# Patient Record
Sex: Male | Born: 1970 | Race: White | Hispanic: No | Marital: Married | Smoking: Former smoker
Health system: Southern US, Community
[De-identification: ages and names within clinical notes are randomized; demographics above are authoritative.]

## PROBLEM LIST (undated history)

## (undated) DIAGNOSIS — R5383 Other fatigue: Secondary | ICD-10-CM

---

## 2006-10-17 ENCOUNTER — Ambulatory Visit: Payer: Self-pay | Admitting: Family Medicine

## 2006-10-20 DIAGNOSIS — F988 Other specified behavioral and emotional disorders with onset usually occurring in childhood and adolescence: Secondary | ICD-10-CM

## 2006-11-17 ENCOUNTER — Ambulatory Visit: Payer: Self-pay | Admitting: Family Medicine

## 2006-12-12 ENCOUNTER — Telehealth: Payer: Self-pay | Admitting: Family Medicine

## 2007-01-21 ENCOUNTER — Telehealth: Payer: Self-pay | Admitting: Family Medicine

## 2007-05-19 ENCOUNTER — Telehealth: Payer: Self-pay | Admitting: Family Medicine

## 2007-06-12 ENCOUNTER — Telehealth: Payer: Self-pay | Admitting: Family Medicine

## 2007-07-09 ENCOUNTER — Ambulatory Visit: Payer: Self-pay | Admitting: Family Medicine

## 2007-07-09 DIAGNOSIS — L259 Unspecified contact dermatitis, unspecified cause: Secondary | ICD-10-CM

## 2007-07-17 ENCOUNTER — Telehealth: Payer: Self-pay | Admitting: Family Medicine

## 2007-08-13 ENCOUNTER — Telehealth: Payer: Self-pay | Admitting: Family Medicine

## 2007-09-14 ENCOUNTER — Telehealth: Payer: Self-pay | Admitting: Family Medicine

## 2007-10-16 ENCOUNTER — Telehealth: Payer: Self-pay | Admitting: Family Medicine

## 2007-11-04 ENCOUNTER — Ambulatory Visit: Payer: Self-pay | Admitting: Family Medicine

## 2007-11-04 DIAGNOSIS — K5289 Other specified noninfective gastroenteritis and colitis: Secondary | ICD-10-CM | POA: Insufficient documentation

## 2007-11-17 ENCOUNTER — Telehealth: Payer: Self-pay | Admitting: Family Medicine

## 2008-01-05 ENCOUNTER — Telehealth: Payer: Self-pay | Admitting: Family Medicine

## 2008-01-28 ENCOUNTER — Ambulatory Visit: Payer: Self-pay | Admitting: Family Medicine

## 2008-01-28 DIAGNOSIS — J019 Acute sinusitis, unspecified: Secondary | ICD-10-CM

## 2008-02-05 ENCOUNTER — Telehealth: Payer: Self-pay | Admitting: Family Medicine

## 2008-03-04 ENCOUNTER — Telehealth: Payer: Self-pay | Admitting: Family Medicine

## 2008-04-04 ENCOUNTER — Telehealth: Payer: Self-pay | Admitting: Family Medicine

## 2008-05-04 ENCOUNTER — Telehealth: Payer: Self-pay | Admitting: Family Medicine

## 2008-06-06 ENCOUNTER — Telehealth: Payer: Self-pay | Admitting: Family Medicine

## 2008-06-28 ENCOUNTER — Ambulatory Visit: Payer: Self-pay | Admitting: Family Medicine

## 2008-07-07 ENCOUNTER — Telehealth: Payer: Self-pay | Admitting: Family Medicine

## 2008-07-25 ENCOUNTER — Telehealth: Payer: Self-pay | Admitting: Family Medicine

## 2008-07-28 ENCOUNTER — Ambulatory Visit: Payer: Self-pay | Admitting: Family Medicine

## 2008-07-28 ENCOUNTER — Encounter: Payer: Self-pay | Admitting: Family Medicine

## 2008-08-02 ENCOUNTER — Telehealth: Payer: Self-pay | Admitting: Family Medicine

## 2008-08-10 ENCOUNTER — Telehealth: Payer: Self-pay | Admitting: Family Medicine

## 2008-08-11 ENCOUNTER — Telehealth: Payer: Self-pay | Admitting: Family Medicine

## 2008-09-19 ENCOUNTER — Telehealth: Payer: Self-pay | Admitting: Family Medicine

## 2008-10-24 ENCOUNTER — Telehealth: Payer: Self-pay | Admitting: Family Medicine

## 2008-12-02 ENCOUNTER — Telehealth: Payer: Self-pay | Admitting: Family Medicine

## 2009-01-19 ENCOUNTER — Telehealth: Payer: Self-pay | Admitting: Family Medicine

## 2009-03-02 ENCOUNTER — Telehealth: Payer: Self-pay | Admitting: Family Medicine

## 2009-04-07 ENCOUNTER — Telehealth: Payer: Self-pay | Admitting: Family Medicine

## 2009-05-12 ENCOUNTER — Telehealth: Payer: Self-pay | Admitting: Family Medicine

## 2009-06-14 ENCOUNTER — Telehealth: Payer: Self-pay | Admitting: Family Medicine

## 2009-07-12 ENCOUNTER — Telehealth: Payer: Self-pay | Admitting: Family Medicine

## 2009-08-14 ENCOUNTER — Telehealth: Payer: Self-pay | Admitting: Family Medicine

## 2009-11-27 ENCOUNTER — Ambulatory Visit: Payer: Self-pay | Admitting: Family Medicine

## 2010-01-05 ENCOUNTER — Telehealth (INDEPENDENT_AMBULATORY_CARE_PROVIDER_SITE_OTHER): Payer: Self-pay | Admitting: *Deleted

## 2010-03-05 ENCOUNTER — Telehealth: Payer: Self-pay | Admitting: Family Medicine

## 2010-04-23 ENCOUNTER — Telehealth: Payer: Self-pay | Admitting: Family Medicine

## 2010-05-24 ENCOUNTER — Telehealth: Payer: Self-pay | Admitting: Family Medicine

## 2010-06-27 ENCOUNTER — Ambulatory Visit: Payer: Self-pay | Admitting: Family Medicine

## 2010-06-27 DIAGNOSIS — R109 Unspecified abdominal pain: Secondary | ICD-10-CM | POA: Insufficient documentation

## 2010-06-27 LAB — CONVERTED CEMR LAB
Ketones, urine, test strip: NEGATIVE
Potassium: 4.2 meq/L (ref 3.5–5.3)
Protein, U semiquant: NEGATIVE
Sodium: 137 meq/L (ref 135–145)
Urobilinogen, UA: 0.2
WBC Urine, dipstick: NEGATIVE

## 2010-06-28 ENCOUNTER — Encounter: Admission: RE | Admit: 2010-06-28 | Discharge: 2010-06-28 | Payer: Self-pay | Admitting: Emergency Medicine

## 2010-06-28 ENCOUNTER — Encounter: Payer: Self-pay | Admitting: Family Medicine

## 2010-07-06 ENCOUNTER — Telehealth: Payer: Self-pay | Admitting: Family Medicine

## 2010-08-08 ENCOUNTER — Telehealth: Payer: Self-pay | Admitting: Family Medicine

## 2011-01-22 NOTE — Letter (Signed)
Summary: Out of Work  [facility]  [address]   [place], [place] [address]   Phone: [phone]  Fax: [phone]    June 28, 2010   Employee:  Warren Rivas    To Whom It May Concern:   For Medical reasons, please excuse the above named employee from work today and tomorrow.   If you need additional information, please feel free to contact our office.         Sincerely,    Donna Christen MD

## 2011-01-22 NOTE — Progress Notes (Signed)
Summary: Adderall refill  Phone Note Refill Request Message from:  Patient on Apr 23, 2010 2:47 PM  Refills Requested: Medication #1:  ADDERALL XR 25 MG CP24 Take 1 tablet by mouth once a day AM. Initial call taken by: Payton Spark CMA,  Apr 23, 2010 2:47 PM    Prescriptions: ADDERALL XR 25 MG CP24 (AMPHETAMINE-DEXTROAMPHETAMINE) Take 1 tablet by mouth once a day AM  #30 x 0   Entered and Authorized by:   Seymour Bars DO   Signed by:   Seymour Bars DO on 04/23/2010   Method used:   Print then Give to Patient   RxID:   [id number]

## 2011-01-22 NOTE — Progress Notes (Signed)
Summary: Adderall refill  Phone Note Refill Request Message from:  Patient on August 08, 2010 12:18 PM  Refills Requested: Medication #1:  ADDERALL XR 25 MG CP24 Take 1 tablet by mouth once a day AM Initial call taken by: Payton Spark CMA,  August 08, 2010 12:18 PM  Follow-up for Phone Call        due for OV prior to RF Follow-up by: Seymour Bars DO,  August 08, 2010 1:01 PM     Appended Document: Adderall refill Pt aware of the above

## 2011-01-22 NOTE — Progress Notes (Signed)
Summary: Adderall refill  Phone Note Refill Request   Refills Requested: Medication #1:  ADDERALL XR 25 MG CP24 Take 1 tablet by mouth once a day AM Initial call taken by: Payton Spark CMA,  July 06, 2010 9:55 AM    Prescriptions: ADDERALL XR 25 MG CP24 (AMPHETAMINE-DEXTROAMPHETAMINE) Take 1 tablet by mouth once a day AM  #30 x 0   Entered and Authorized by:   Seymour Bars DO   Signed by:   Seymour Bars DO on 07/06/2010   Method used:   Print then Give to Patient   RxID:   [id number]

## 2011-01-22 NOTE — Progress Notes (Signed)
Summary: Adderall refill  Phone Note Refill Request   Refills Requested: Medication #1:  ADDERALL XR 25 MG CP24 Take 1 tablet by mouth once a day AM. Initial call taken by: Payton Spark CMA,  March 05, 2010 12:40 PM    Prescriptions: ADDERALL XR 25 MG CP24 (AMPHETAMINE-DEXTROAMPHETAMINE) Take 1 tablet by mouth once a day AM  #30 x 0   Entered and Authorized by:   Nani Gasser MD   Signed by:   Nani Gasser MD on 03/05/2010   Method used:   Print then Give to Patient   RxID:   [id number]

## 2011-01-22 NOTE — Progress Notes (Signed)
Summary: Adderall refill  Phone Note Refill Request   Refills Requested: Medication #1:  ADDERALL XR 25 MG CP24 Take 1 tablet by mouth once a day AM. Initial call taken by: Payton Spark CMA,  May 24, 2010 9:26 AM    Prescriptions: ADDERALL XR 25 MG CP24 (AMPHETAMINE-DEXTROAMPHETAMINE) Take 1 tablet by mouth once a day AM  #30 x 0   Entered and Authorized by:   Seymour Bars DO   Signed by:   Seymour Bars DO on 05/24/2010   Method used:   Print then Give to Patient   RxID:   [id number]

## 2011-01-22 NOTE — Progress Notes (Signed)
Summary: Adderall Rx  Phone Note Refill Request Message from:  Patient on January 05, 2010 9:01 AM  Refills Requested: Medication #1:  ADDERALL XR 25 MG CP24 Take 1 tablet by mouth once a day AM. Initial call taken by: Payton Spark CMA,  January 05, 2010 9:01 AM    Prescriptions: ADDERALL XR 25 MG CP24 (AMPHETAMINE-DEXTROAMPHETAMINE) Take 1 tablet by mouth once a day AM  #30 x 0   Entered by:   Payton Spark CMA   Authorized by:   Seymour Bars DO   Signed by:   Payton Spark CMA on 01/05/2010   Method used:   Print then Give to Patient   RxID:   [id number]

## 2011-01-22 NOTE — Assessment & Plan Note (Signed)
Summary: STOMACH/BACK PAIN   Vital Signs:  Patient Profile:   40 Years Old Male CC:      back and abdominal pain X 4 days Height:     69.75 inches Weight:      188 pounds O2 Sat:      100 % O2 treatment:    Room Air Temp:     97.0 degrees F oral Pulse rate:   55 / minute Resp:     14 per minute BP sitting:   132 / 93  (right arm) Cuff size:   regular  Pt. in pain?   yes    Location:   abdomen/lower back    Intensity:   4    Type:       dull  Vitals Entered By: Lajean Saver RN (June 27, 2010 10:22 AM)                   Updated Prior Medication List: ADDERALL XR 25 MG CP24 (AMPHETAMINE-DEXTROAMPHETAMINE) Take 1 tablet by mouth once a day AM  Current Allergies (reviewed today): No known allergies History of Present Illness Chief Complaint: back and abdominal pain X 4 days History of Present Illness:  Subjective:  Patient complains of onset of lower abdominal pain 5 days ago after eating a hamburger for supper.  Since then he has had constant hypogastric crampy pain, occasionally radiating to the right back, and over the past 24 hours radiating to the groin area.  He has had mild nausea (but no vomiting) and anorexia.  He has had continuous tenesmus, but minimal bowel movements.  He had chills and low grade fever yesterday, and felt dizzy today.  No urinary symptoms.  No respiratory symptoms.  No chest pain.  His lower abdominal pain is somewhat worse with movement.  No recent change in bowel movements prior to his present illness.  He takes Adderall for ADD. No past surgical history.  He has been healthy in the past.    REVIEW OF SYSTEMS Constitutional Symptoms      Denies fever, chills, night sweats, weight loss, weight gain, and fatigue.  Eyes       Denies change in vision, eye pain, eye discharge, glasses, contact lenses, and eye surgery. Ear/Nose/Throat/Mouth       Denies hearing loss/aids, change in hearing, ear pain, ear discharge, dizziness, frequent runny nose,  frequent nose bleeds, sinus problems, sore throat, hoarseness, and tooth pain or bleeding.  Respiratory       Denies dry cough, productive cough, wheezing, shortness of breath, asthma, bronchitis, and emphysema/COPD.  Cardiovascular       Denies murmurs, chest pain, and tires easily with exhertion.    Gastrointestinal       Complains of stomach pain, nausea/vomiting, and diarrhea.      Denies constipation, blood in bowel movements, and indigestion. Genitourniary       Denies painful urination, kidney stones, and loss of urinary control.      Comments: urinary frequency/urgency Neurological       Denies paralysis, seizures, and fainting/blackouts. Musculoskeletal       Denies muscle pain, joint pain, joint stiffness, decreased range of motion, redness, swelling, muscle weakness, and gout.  Skin       Denies bruising, unusual mles/lumps or sores, and hair/skin or nail changes.  Psych       Denies mood changes, temper/anger issues, anxiety/stress, speech problems, depression, and sleep problems. Other Comments: symptoms started late friday night sunnenly. Intermittent thorughout the  weekend, and resumed suddenly today.   Past History:  Past Medical History: Reviewed history from 07/09/2007 and no changes required. ADD  Past Surgical History: Denies surgical history  Family History: Reviewed history from 10/20/2006 and no changes required. father died of lung cancer mother healthy brother and sister healthy  Social History: Reviewed history from 10/20/2006 and no changes required. Patient is a former smoker.  Route driver for [employer] Married to Amy.  Has 3 kids. Exercises regularly   Objective:  Appearance:  Patient appears healthy, stated age, and in no acute distress  Eyes:  Pupils are equal, round, and reactive to light and accomdation.  Extraocular movement is intact.  Conjunctivae are not inflamed.  Mouth:  moist mucous membranes  Neck:  Supple.  No adenopathy is  present.  No thyromegaly is present  Lungs:  Clear to auscultation.  Breath sounds are equal.  Heart:  Regular rate and rhythm without murmurs, rubs, or gallops.  Abdomen:   There is tenderness in the peri-umbilical and left lower abdomen over course of descending colon.  No masses or hepatosplenomegaly.  Bowel sounds are present.  No CVA or flank tenderness.  No distinct tenderness over McBurney's point.  No rebound tenderness. Genitourinary:  Penis normal without lesions or urethral discharge.  Scrotum is normal.  Testes are descended bilaterally without nodules or tenderness.  No hernias are palpated.  No regional lymphadenopathy palpated.   Rectal Exam:  Anus is normal without surrounding erythema.   No external hemorrhoids are present.  No lesions are palpated in the rectal vault.  Stool is heme negative.  Prostate is slightly enlarged but symmetric without tenderness or nodules.  urinalysis (dipstick):  negative CBC:  WBC 5.9, Hgb 14.6 Assessment New Problems: ABDOMINAL PAIN (ICD-789.00)  SUSPECT DIVERTICULITIS  Plan New Medications/Changes: LORTAB 5 5-500 MG TABS (HYDROCODONE-ACETAMINOPHEN) One by mouth q4 to 6hr as needed pain.  #10 (ten) x 0, 06/27/2010, Donna Christen MD ZOFRAN 4 MG TABS (ONDANSETRON HCL) One or two tabs by mouth two times a day as needed for nausea  #Ten (10) x 0, 06/27/2010, Donna Christen MD METRONIDAZOLE 500 MG TABS (METRONIDAZOLE) One by mouth q6hr  #28 x 0, 06/27/2010, Donna Christen MD CIPROFLOXACIN HCL 750 MG TABS (CIPROFLOXACIN HCL) One by mouth two times a day  #14 x 0, 06/27/2010, Donna Christen MD  New Orders: Urinalysis [81003-65000] CBC w/Diff [09811-91478] T-Basic Metabolic Panel [[id number]] T-Comprehensive Metabolic Panel [80053-22900] T-DG ABD 2 Views [74020] CT Abdomen/Pelvis w/ Contrast [CT a/p w/cont] Planning Comments:   Clear liquids through tomorrow then advance.  Begin Cipro and Flagyl.  Analgesic. Zofran for nausea. Return for worsening  symptoms.   The patient and/or caregiver has been counseled thoroughly with regard to medications prescribed including dosage, schedule, interactions, rationale for use, and possible side effects and they verbalize understanding.  Diagnoses and expected course of recovery discussed and will return if not improved as expected or if the condition worsens. Patient and/or caregiver verbalized understanding.  Prescriptions: LORTAB 5 5-500 MG TABS (HYDROCODONE-ACETAMINOPHEN) One by mouth q4 to 6hr as needed pain.  #10 (ten) x 0   Entered and Authorized by:   Donna Christen MD   Signed by:   Donna Christen MD on 06/27/2010   Method used:   Print then Give to Patient   RxID:   [id number] ZOFRAN 4 MG TABS (ONDANSETRON HCL) One or two tabs by mouth two times a day as needed for nausea  #Ten (10) x 0   Entered and  Authorized by:   Donna Christen MD   Signed by:   Donna Christen MD on 06/27/2010   Method used:   Print then Give to Patient   RxID:   [id number] METRONIDAZOLE 500 MG TABS (METRONIDAZOLE) One by mouth q6hr  #28 x 0   Entered and Authorized by:   Donna Christen MD   Signed by:   Donna Christen MD on 06/27/2010   Method used:   Print then Give to Patient   RxID:   [id number] CIPROFLOXACIN HCL 750 MG TABS (CIPROFLOXACIN HCL) One by mouth two times a day  #14 x 0   Entered and Authorized by:   Donna Christen MD   Signed by:   Donna Christen MD on 06/27/2010   Method used:   Print then Give to Patient   RxID:   [id number]   Patient Instructions: 1)  Will obtain CT w/ contrast either today or tomorrow.  If pain increases or worsening fever or new symptoms, go to the ER.  Will call patient with results of CT and with lab results.  Patient understands and agrees to this.  May need to schedule a follow up with your PCP or gastroenterologist depending on the test results and on how your treatment goes.  Orders Added: 1)  Urinalysis [81003-65000] 2)  CBC w/Diff [[id number]] 3)   T-Basic Metabolic Panel [[id number]] 4)  T-Comprehensive Metabolic Panel [80053-22900] 5)  T-DG ABD 2 Views [74020] 6)  CT Abdomen/Pelvis w/ Contrast [CT a/p w/cont]  Laboratory Results   Urine Tests  Date/Time Received: June 27, 2010 11:24 AM  Date/Time Reported: June 27, 2010 11:24 AM   Routine Urinalysis   Color: yellow Appearance: Clear Glucose: negative   (Normal Range: Negative) Bilirubin: negative   (Normal Range: Negative) Ketone: negative   (Normal Range: Negative) Spec. Gravity: 1.020   (Normal Range: 1.003-1.035) Blood: moderate   (Normal Range: Negative) pH: 5.5   (Normal Range: 5.0-8.0) Protein: negative   (Normal Range: Negative) Urobilinogen: 0.2   (Normal Range: 0-1) Nitrite: negative   (Normal Range: Negative) Leukocyte Esterace: negative   (Normal Range: Negative)

## 2012-10-06 ENCOUNTER — Emergency Department
Admission: EM | Admit: 2012-10-06 | Discharge: 2012-10-06 | Disposition: A | Discharge status: Home / Self Care | Payer: BC Managed Care – PPO | Source: Home / Self Care

## 2012-10-06 ENCOUNTER — Ambulatory Visit (HOSPITAL_BASED_OUTPATIENT_CLINIC_OR_DEPARTMENT_OTHER)
Admission: RE | Admit: 2012-10-06 | Discharge: 2012-10-06 | Disposition: A | Discharge status: Home / Self Care | Payer: BC Managed Care – PPO | Source: Ambulatory Visit | Attending: Family Medicine | Admitting: Family Medicine

## 2012-10-06 ENCOUNTER — Other Ambulatory Visit: Payer: Self-pay | Admitting: Family Medicine

## 2012-10-06 ENCOUNTER — Encounter: Payer: Self-pay | Admitting: *Deleted

## 2012-10-06 DIAGNOSIS — R109 Unspecified abdominal pain: Secondary | ICD-10-CM

## 2012-10-06 DIAGNOSIS — K859 Acute pancreatitis without necrosis or infection, unspecified: Secondary | ICD-10-CM

## 2012-10-06 LAB — POCT CBC W AUTO DIFF (K'VILLE URGENT CARE)

## 2012-10-06 MED ORDER — IOHEXOL 300 MG/ML  SOLN
100.0000 mL | Freq: Once | INTRAMUSCULAR | Status: AC | PRN
  Administered 2012-10-06: 100 mL via INTRAVENOUS

## 2012-10-06 MED ORDER — HYDROCODONE-ACETAMINOPHEN 5-500 MG PO TABS: 1.0000 | ORAL_TABLET | Freq: Four times a day (QID) | ORAL | Status: DC | PRN

## 2012-10-06 MED ORDER — ONDANSETRON HCL 4 MG PO TABS: 4.0000 mg | ORAL_TABLET | Freq: Three times a day (TID) | ORAL | Status: DC | PRN

## 2012-10-06 NOTE — ED Notes (Signed)
Pt c/o increased bowel sounds x 2 wks. He also c/o abdominal cramping and a little nausea x today. Denies fever.

## 2012-10-06 NOTE — ED Provider Notes (Signed)
History     CSN: [id number]  Arrival date & time 10/06/12  1141   First MD Initiated Contact with Patient 10/06/12 1151      Chief Complaint  Patient presents with  . Abdominal Pain   HPI Pt presents today with abdominal pain x 1 day.  Pt reports a baseline hx/o pancreatitis flare back in 2011.  Pt was seen by GI at follow up and that was the working diagnosis.  Pt states that he has been relatively asymptomatic since this point. However, he has noticed worsening stomach "gurgling" and discomfort.  Pt states that he woke up this am with central abdominal pain that radiated to his back.  Pt states that pain seems to intermittent with around 1 min episodes of periumbilical/central pain.  Pain radiates to back. Flares have been around 8/10. Last flare was 2 hours ago.  Pt denies any trauma.  No ETOH, smoking. No known hx/o gallstones per pt.  Currently not on medications.  Has been able to tolerate po liquids well.  No diarrhea, vomiting, dysuria, or increased urinary frequency.   History reviewed. No pertinent past medical history.  History reviewed. No pertinent past surgical history.  Family History  Problem Relation Age of Onset  . Cancer Father     lung    History  Substance Use Topics  . Smoking status: Former Games developer  . Smokeless tobacco: Not on file  . Alcohol Use: No      Review of Systems  All other systems reviewed and are negative.    Allergies  Review of patient's allergies indicates no known allergies.  Home Medications  No current outpatient prescriptions on file.  BP 127/85  Pulse 63  Temp 98 F (36.7 C) (Oral)  Resp 18  Ht 5\' 10"  (1.778 m)  Wt 200 lb (90.719 kg)  BMI 28.70 kg/m2  SpO2 99%  Physical Exam  Constitutional: He appears well-developed and well-nourished.  HENT:  Head: Normocephalic and atraumatic.  Eyes: Conjunctivae normal are normal. Pupils are equal, round, and reactive to light.  Neck: Normal range of motion. Neck  supple.  Cardiovascular: Normal rate and regular rhythm.   Pulmonary/Chest: Effort normal and breath sounds normal.  Abdominal: Soft. Bowel sounds are normal.       No flank pain/CVA tenderness.  + mild periumbilical pain.  No RLQ pain/guarding.  No peritoneal signs.   Musculoskeletal: Normal range of motion.  Neurological: He is alert.  Skin: Skin is warm.    ED Course  Procedures (including critical care time)   Labs Reviewed  POCT CBC W AUTO DIFF ([facility])  COMPREHENSIVE METABOLIC PANEL  LACTATE DEHYDROGENASE  LIPASE, BLOOD   No results found.   1. Abdominal  pain, other specified site   2. Pancreatitis       MDM  Suspect that this may be a recurrence of pancreatitis given history.  WIll check baseline labs including CBC, CMET, LDH, and lipase.  Will also check CT Abd and Pelvis with contrast to eval pancreas.  Will place on bowel rest with prn vicodin and zofran for symptomatic improvement.  Follow up with gastroenterology in 24-48 hours.  Discussed systemic and GI red flags at length including fever, worsening abd pain, and uncontrollable nausea for reevaluation.  Pt feels comfortable with outpatient management of sxs currently as this has worked well in the past. Broached issue of inpt management if sxs worsen.      The patient and/or caregiver has been counseled thoroughly  with regard to treatment plan and/or medications prescribed including dosage, schedule, interactions, rationale for use, and possible side effects and they verbalize understanding. Diagnoses and expected course of recovery discussed and will return if not improved as expected or if the condition worsens. Patient and/or caregiver verbalized understanding.            Doree Albee, MD 10/06/12 1257

## 2012-10-06 NOTE — ED Notes (Signed)
Patient schedule to have CT with contrast @ [facility]. Oral contrast given and explained. Verbalized understanding.

## 2012-10-07 LAB — COMPREHENSIVE METABOLIC PANEL
ALT: 14 U/L (ref 0–53)
AST: 19 U/L (ref 0–37)
Albumin: 4.6 g/dL (ref 3.5–5.2)
BUN: 12 mg/dL (ref 6–23)
Chloride: 104 mEq/L (ref 96–112)
Creat: 0.98 mg/dL (ref 0.50–1.35)
Glucose, Bld: 98 mg/dL (ref 70–99)
Total Bilirubin: 0.4 mg/dL (ref 0.3–1.2)

## 2012-10-07 LAB — LACTATE DEHYDROGENASE: LDH: 136 U/L (ref 94–250)

## 2012-10-08 ENCOUNTER — Telehealth: Payer: Self-pay | Admitting: Emergency Medicine

## 2013-04-17 IMAGING — CT CT ABD-PELV W/ CM
2 of 5 series · 17 of 46 positions shown, 19 images · IV contrast (omnipaque)
Comparison: 06/28/2010 CT

CLINICAL DATA: 40-year-old male with abdominal and pelvic pain.

CT ABDOMEN AND PELVIS WITH CONTRAST
TECHNIQUE: Multidetector CT imaging of the abdomen and pelvis was
performed following the standard protocol during bolus
administration of intravenous contrast.
Contrast: 100mL OMNIPAQUE IOHEXOL 300 MG/ML  SOLN

[Series 2: abd/pelvis 5.0 b31f · axial · 0.75mm/px · z∈[-506,-71]mm · 14 of 99 slices shown, 16 images]
[im 6/99  soft-tissue]
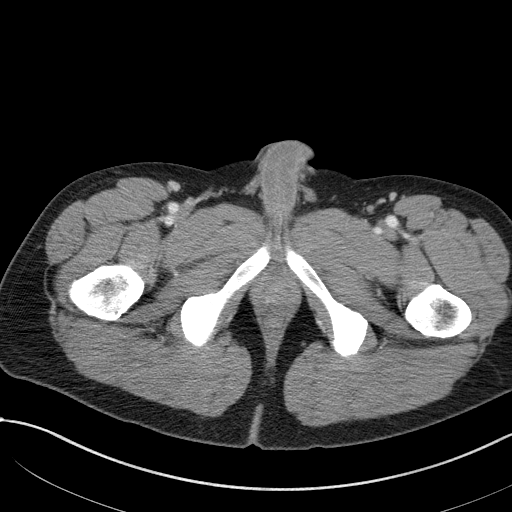
[im 6/99  bone]
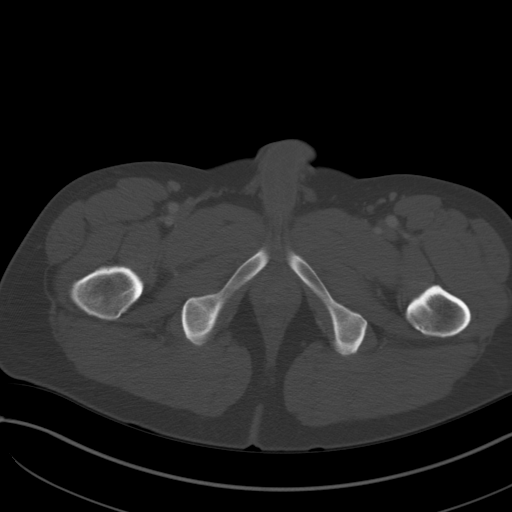
[im 11/99  soft-tissue]
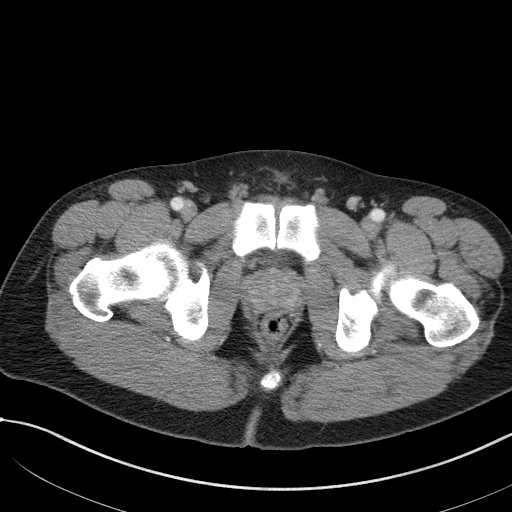
[im 22/99  soft-tissue]
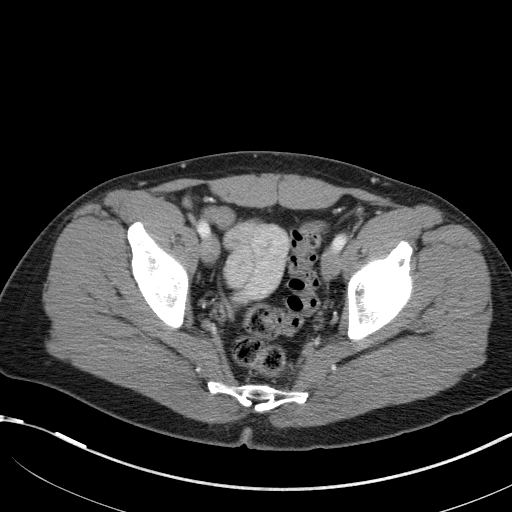
[im 28/99  soft-tissue]
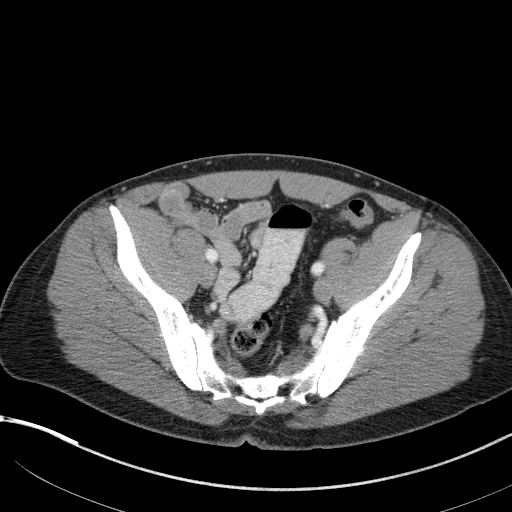
[im 33/99  soft-tissue]
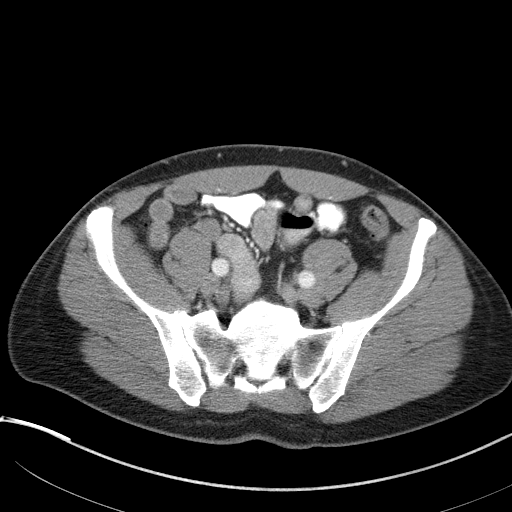
[im 39/99  soft-tissue]
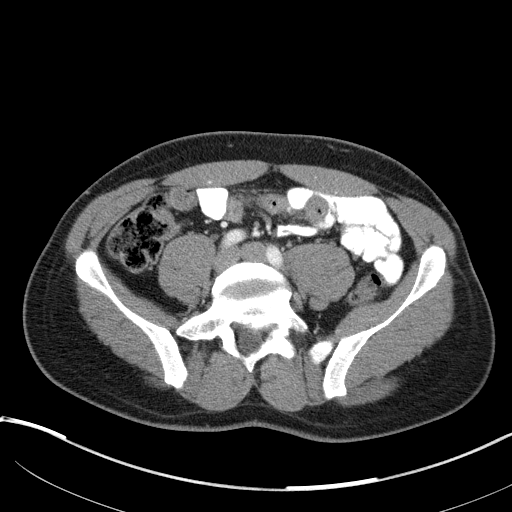
[im 44/99  soft-tissue]
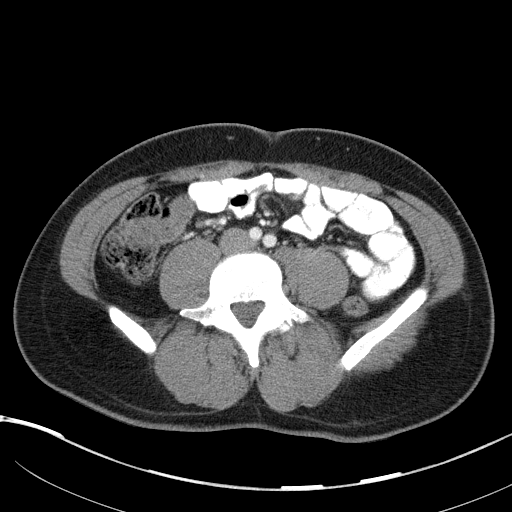
[im 55/99  soft-tissue]
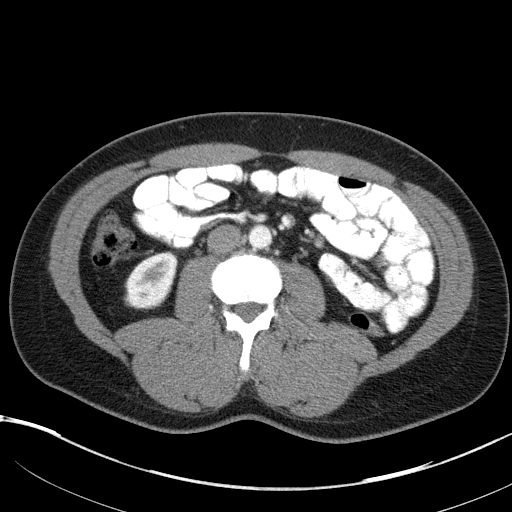
[im 60/99  soft-tissue]
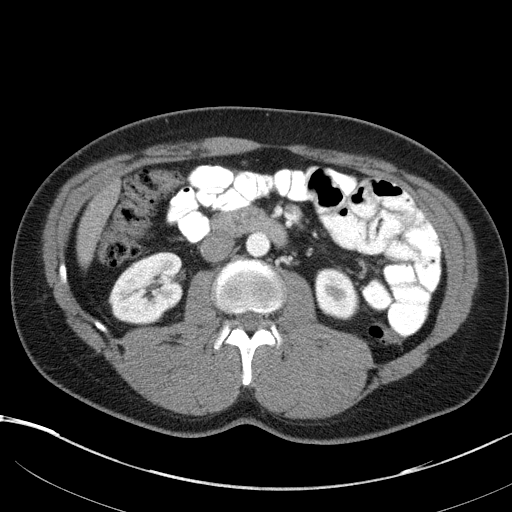
[im 60/99  bone]
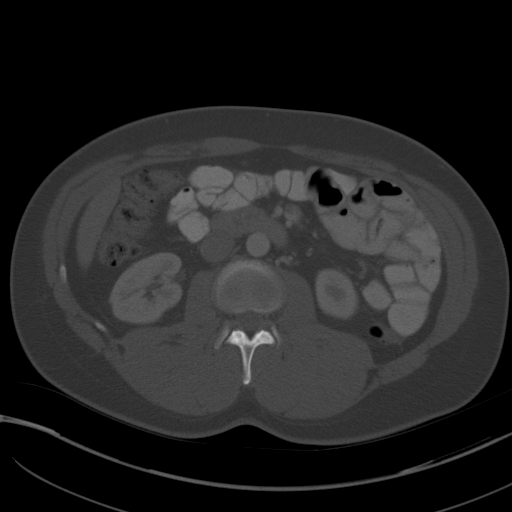
[im 66/99  soft-tissue]
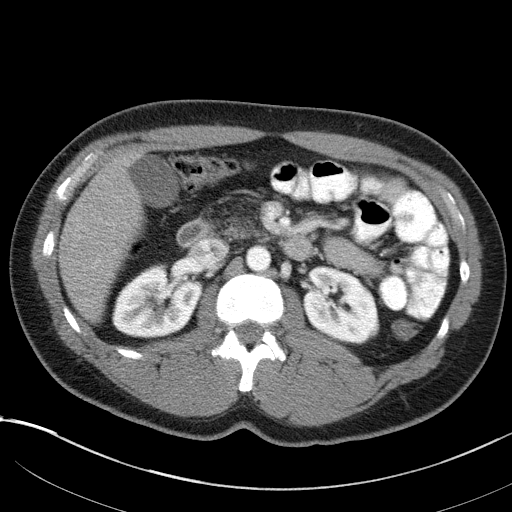
[im 71/99  soft-tissue]
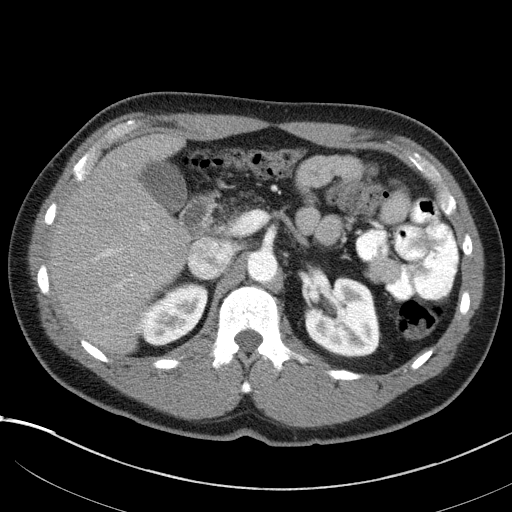
[im 77/99  soft-tissue]
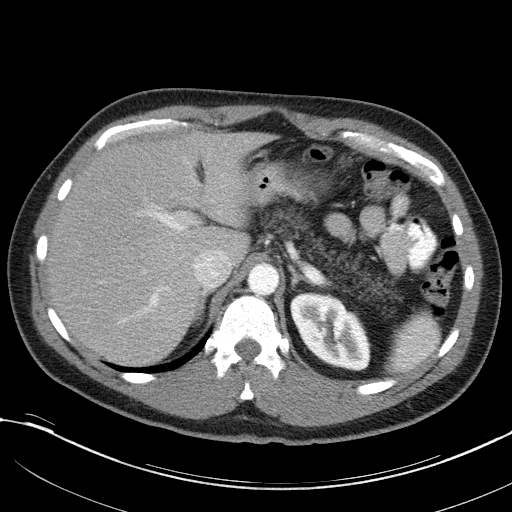
[im 88/99  soft-tissue]
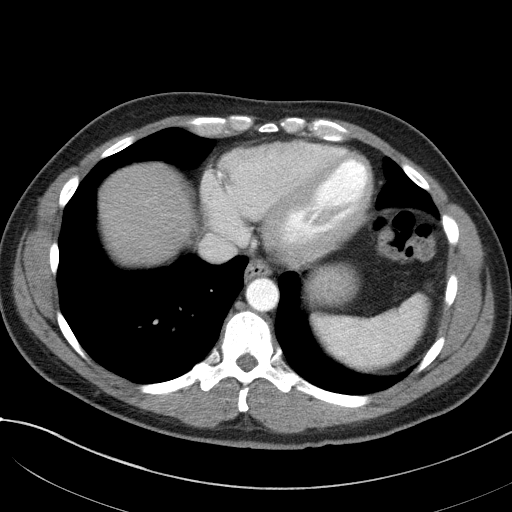
[im 93/99  soft-tissue]
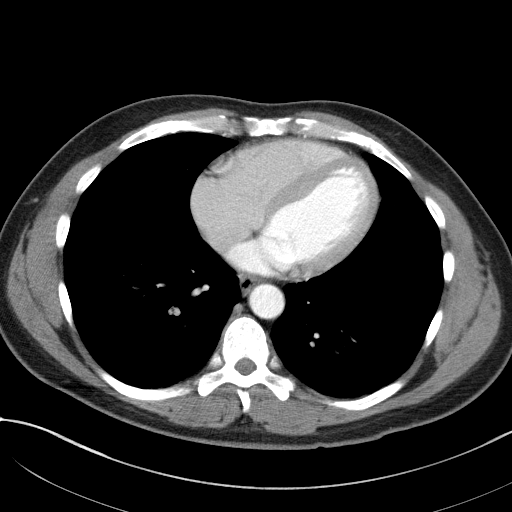

[Series 5: abd/pelvis 3.0 coronal · coronal · 0.79mm/px · 3 of 87 slices shown]
[im 29/87  soft-tissue]
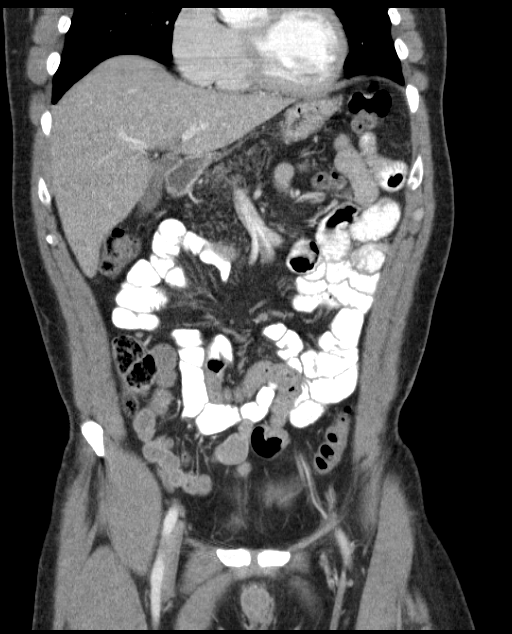
[im 39/87  soft-tissue]
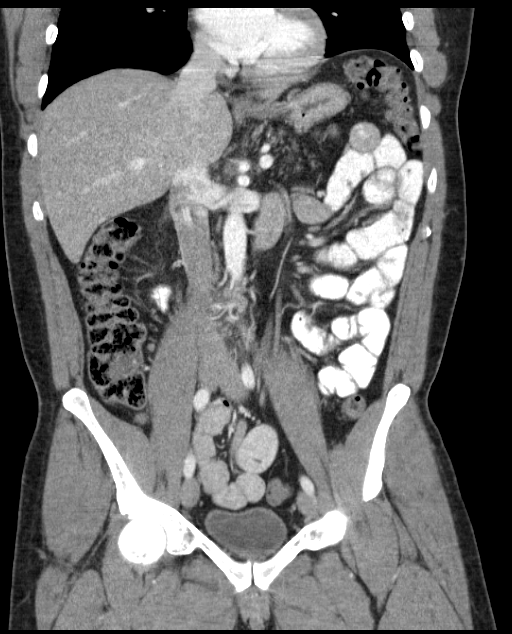
[im 48/87  soft-tissue]
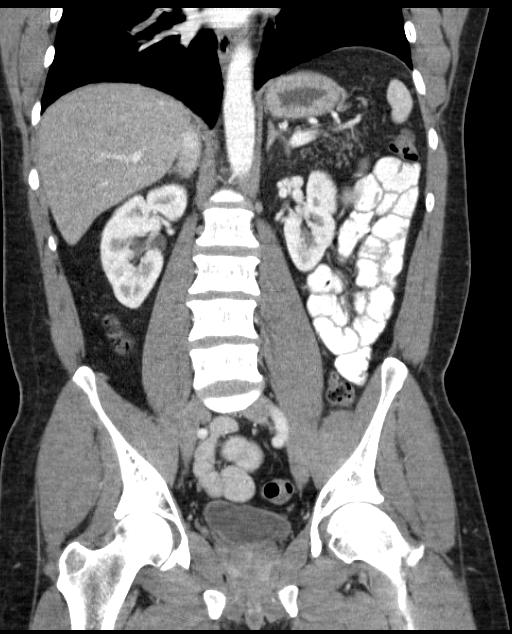

[17 of 46 positions shown; findings below may reference images not displayed]

FINDINGS: Probable mild fatty infiltration of the liver is
identified.
The spleen, gallbladder, adrenal glands and kidneys are
unremarkable.
Fatty replacement of the pancreas is again identified.  There is no
evidence of peripancreatic inflammation or fluid.

No free fluid, enlarged lymph nodes, biliary dilation or abdominal
aortic aneurysm identified.

The bowel and bladder are unremarkable.
The appendix is normal.
No acute or suspicious bony abnormalities identified.
IMPRESSION: No evidence of acute abnormality.

Question mild fatty infiltration of the liver.

## 2013-10-19 ENCOUNTER — Ambulatory Visit (INDEPENDENT_AMBULATORY_CARE_PROVIDER_SITE_OTHER): Payer: BC Managed Care – PPO | Admitting: Family Medicine

## 2013-10-19 ENCOUNTER — Encounter: Payer: Self-pay | Admitting: Family Medicine

## 2013-10-19 VITALS — BP 123/79 | HR 68 | Wt 202.0 lb

## 2013-10-19 DIAGNOSIS — F988 Other specified behavioral and emotional disorders with onset usually occurring in childhood and adolescence: Secondary | ICD-10-CM

## 2013-10-19 MED ORDER — AMPHETAMINE-DEXTROAMPHET ER 25 MG PO CP24: 25.0000 mg | ORAL_CAPSULE | ORAL | Status: DC

## 2013-10-19 NOTE — Progress Notes (Signed)
  Subjective:    Patient ID: Warren Rivas, male    DOB: 1971-01-26, 42 y.o.   MRN: [id number]  HPI ADD- Dx years ago.  He is interested in getting back on Adderall. Was dx as a child. Tried concerta years ago, about 8-9 years and didn't work well. Then on adderrall. No CP or palps or SOB on the medications.  No sleep issues on it.  He says work has been stressful and feels like needs to restart the adderal.    Review of Systems BP 123/79  Pulse 68  Wt 202 lb (91.627 kg)  BMI 28.98 kg/m2    No Known Allergies  No past medical history on file.  No past surgical history on file.  History   Social History  . Marital Status: Married    Spouse Name: N/A    Number of Children: N/A  . Years of Education: N/A   Occupational History  . Not on file.   Social History Main Topics  . Smoking status: Former Games developer  . Smokeless tobacco: Not on file  . Alcohol Use: No  . Drug Use: No  . Sexual Activity:    Other Topics Concern  . Not on file   Social History Narrative  . No narrative on file    Family History  Problem Relation Age of Onset  . Cancer Father     lung    Outpatient Encounter Prescriptions as of 10/19/2013  Medication Sig Dispense Refill  . amphetamine-dextroamphetamine (ADDERALL XR) 25 MG 24 hr capsule Take 1 capsule (25 mg total) by mouth every morning.  30 capsule  0  . [DISCONTINUED] HYDROcodone-acetaminophen (VICODIN) 5-500 MG per tablet Take 1 tablet by mouth every 6 (six) hours as needed for pain.  30 tablet  0  . [DISCONTINUED] ondansetron (ZOFRAN) 4 MG tablet Take 1 tablet (4 mg total) by mouth every 8 (eight) hours as needed for nausea.  20 tablet  0   No facility-administered encounter medications on file as of 10/19/2013.          Objective:   Physical Exam  Constitutional: He is oriented to person, place, and time. He appears well-developed and well-nourished.  HENT:  Head: Normocephalic and atraumatic.  Cardiovascular: Normal rate,  regular rhythm and normal heart sounds.   Pulmonary/Chest: Effort normal and breath sounds normal.  Neurological: He is alert and oriented to person, place, and time.  Skin: Skin is warm and dry.  Psychiatric: He has a normal mood and affect. His behavior is normal.          Assessment & Plan:  ADD - Will restart Adderral 25 mg extended release. Stop immediately if any chest pain, shortness of breath or palpitations. He denies any history of heart problems. Will monitor weight and blood pressure followup in one month.

## 2013-11-16 ENCOUNTER — Ambulatory Visit (INDEPENDENT_AMBULATORY_CARE_PROVIDER_SITE_OTHER): Payer: BC Managed Care – PPO | Admitting: Family Medicine

## 2013-11-16 ENCOUNTER — Encounter: Payer: Self-pay | Admitting: Family Medicine

## 2013-11-16 VITALS — BP 117/77 | HR 62 | Temp 97.2°F | Ht 69.0 in | Wt 202.0 lb

## 2013-11-16 DIAGNOSIS — F988 Other specified behavioral and emotional disorders with onset usually occurring in childhood and adolescence: Secondary | ICD-10-CM

## 2013-11-16 MED ORDER — AMPHETAMINE-DEXTROAMPHET ER 30 MG PO CP24: 30.0000 mg | ORAL_CAPSULE | ORAL | Status: DC

## 2013-11-16 NOTE — Progress Notes (Signed)
  Subjective:    Patient ID: Warren Rivas, male    DOB: 1971/12/08, 42 y.o.   MRN: [id number]  HPI ADD-No palpitaions  Says it is helping.  Wearing off in the late afternoon.   no problems with sleep, no SOB or CP  Has not affected his appetite or sleep.    Review of Systems     Objective:   Physical Exam  Constitutional: He is oriented to person, place, and time. He appears well-developed and well-nourished.  HENT:  Head: Normocephalic and atraumatic.  Cardiovascular: Normal rate, regular rhythm and normal heart sounds.   Pulmonary/Chest: Effort normal and breath sounds normal.  Neurological: He is alert and oriented to person, place, and time.  Skin: Skin is warm and dry.  Psychiatric: He has a normal mood and affect. His behavior is normal.          Assessment & Plan:  ADD- will increase to 30mg .  F/U in 1-2 months for BP. Call if any S.E.

## 2013-12-14 ENCOUNTER — Other Ambulatory Visit: Payer: Self-pay | Admitting: *Deleted

## 2013-12-14 MED ORDER — AMPHETAMINE-DEXTROAMPHET ER 25 MG PO CP24: 25.0000 mg | ORAL_CAPSULE | ORAL | Status: DC

## 2013-12-14 MED ORDER — AMPHETAMINE-DEXTROAMPHET ER 30 MG PO CP24: 30.0000 mg | ORAL_CAPSULE | ORAL | Status: DC

## 2014-01-12 ENCOUNTER — Telehealth: Payer: Self-pay | Admitting: Family Medicine

## 2014-01-12 NOTE — Telephone Encounter (Signed)
Pt called for refill on Adderall.

## 2014-01-12 NOTE — Telephone Encounter (Signed)
Pt called and lvm informed that he was to f/u this month.

## 2014-01-13 MED ORDER — AMPHETAMINE-DEXTROAMPHET ER 30 MG PO CP24: 30.0000 mg | ORAL_CAPSULE | ORAL | Status: DC

## 2014-01-13 NOTE — Addendum Note (Signed)
Addended by: Deno EtienneBARKLEY, Kyriana Yankee L on: 01/13/2014 04:59 PM   Modules accepted: Orders

## 2014-01-13 NOTE — Telephone Encounter (Signed)
Refill given for adderall.Warren PacasBarkley, [place]ivas [place]

## 2014-02-14 ENCOUNTER — Telehealth: Payer: Self-pay | Admitting: *Deleted

## 2014-02-14 ENCOUNTER — Other Ambulatory Visit: Payer: Self-pay | Admitting: *Deleted

## 2014-02-14 MED ORDER — AMPHETAMINE-DEXTROAMPHET ER 25 MG PO CP24: 25.0000 mg | ORAL_CAPSULE | ORAL | Status: DC

## 2014-02-14 NOTE — Telephone Encounter (Signed)
Error

## 2014-02-14 NOTE — Telephone Encounter (Signed)
Pt informed that he will not get any future refills w/o appt. appt scheduled for 3.12.15.Loralee PacasBarkley, Naomia Lenderman [place]

## 2014-03-03 ENCOUNTER — Encounter: Payer: Self-pay | Admitting: Family Medicine

## 2014-03-03 ENCOUNTER — Ambulatory Visit (INDEPENDENT_AMBULATORY_CARE_PROVIDER_SITE_OTHER): Payer: BC Managed Care – PPO | Admitting: Family Medicine

## 2014-03-03 VITALS — BP 125/83 | HR 64 | Ht 70.0 in | Wt 202.0 lb

## 2014-03-03 DIAGNOSIS — F988 Other specified behavioral and emotional disorders with onset usually occurring in childhood and adolescence: Secondary | ICD-10-CM

## 2014-03-03 MED ORDER — AMPHETAMINE-DEXTROAMPHET ER 25 MG PO CP24: 25.0000 mg | ORAL_CAPSULE | ORAL | Status: DC

## 2014-03-03 NOTE — Progress Notes (Signed)
   Subjective:    Patient ID: Warren Rivas, male    DOB: 01/14/1971, 43 y.o.   MRN: [id number]  HPI ADD- Takes it later than before and works well. Was more irritable on the 30mg . Not affect his sleep.  No CP, SOB, or palpitations.     Review of Systems     Objective:   Physical Exam  Constitutional: He is oriented to person, place, and time. He appears well-developed and well-nourished.  HENT:  Head: Normocephalic and atraumatic.  Cardiovascular: Normal rate, regular rhythm and normal heart sounds.   Pulmonary/Chest: Effort normal and breath sounds normal.  Neurological: He is alert and oriented to person, place, and time.  Skin: Skin is warm and dry.  Psychiatric: He has a normal mood and affect. His behavior is normal.          Assessment & Plan:  ADD- Well controlled.  Due for refills . F/U in 4 months. No elevated BP and pulse is ok.

## 2014-08-09 ENCOUNTER — Other Ambulatory Visit: Payer: Self-pay | Admitting: *Deleted

## 2014-08-09 MED ORDER — AMPHETAMINE-DEXTROAMPHET ER 25 MG PO CP24: 25.0000 mg | ORAL_CAPSULE | ORAL | Status: DC

## 2014-08-22 ENCOUNTER — Ambulatory Visit: Payer: BC Managed Care – PPO | Admitting: Family Medicine

## 2014-09-02 ENCOUNTER — Other Ambulatory Visit: Payer: Self-pay | Admitting: *Deleted

## 2014-09-02 MED ORDER — AMPHETAMINE-DEXTROAMPHET ER 25 MG PO CP24: 25.0000 mg | ORAL_CAPSULE | ORAL | Status: DC

## 2014-09-02 NOTE — Telephone Encounter (Signed)
Warren Rivas has appt scheduled for 09/09/14. Corliss Skains, CMA

## 2014-09-09 ENCOUNTER — Ambulatory Visit (INDEPENDENT_AMBULATORY_CARE_PROVIDER_SITE_OTHER): Payer: BC Managed Care – PPO | Admitting: Family Medicine

## 2014-09-09 ENCOUNTER — Encounter: Payer: Self-pay | Admitting: Family Medicine

## 2014-09-09 VITALS — BP 131/82 | HR 85 | Ht 70.0 in | Wt 198.0 lb

## 2014-09-09 DIAGNOSIS — F988 Other specified behavioral and emotional disorders with onset usually occurring in childhood and adolescence: Secondary | ICD-10-CM | POA: Diagnosis not present

## 2014-09-09 DIAGNOSIS — Z13220 Encounter for screening for lipoid disorders: Secondary | ICD-10-CM

## 2014-09-09 MED ORDER — AMPHETAMINE-DEXTROAMPHET ER 25 MG PO CP24: 25.0000 mg | ORAL_CAPSULE | ORAL | Status: DC

## 2014-09-09 NOTE — Assessment & Plan Note (Addendum)
He is doing well and happy with his current regimen. Will refill medication for the next 3 months. He can call for the next refill in December and then followup with me in January. We'll continue to monitor blood pressure.

## 2014-09-09 NOTE — Progress Notes (Signed)
   Subjective:    Patient ID: RAYMIE GIAMMARCO, male    DOB: 21-Sep-1971, 43 y.o.   MRN: [id number]  HPI ADD followup - he was last seen in November 2014. We refilled his medication in January and that was his last refill. He tolerates his medication well without any side effects or problems. No chest pain or palpitations when he uses it. Does not tend to cause insomnia.  His daughter just started college and is excited about this. Review of Systems     Objective:   Physical Exam  Constitutional: He is oriented to person, place, and time. He appears well-developed and well-nourished.  HENT:  Head: Normocephalic and atraumatic.  Cardiovascular: Normal rate, regular rhythm and normal heart sounds.   Pulmonary/Chest: Effort normal and breath sounds normal.  Neurological: He is alert and oriented to person, place, and time.  Skin: Skin is warm and dry.  Psychiatric: He has a normal mood and affect. His behavior is normal.          Assessment & Plan:  Did encourage him to go for CMP and fasting lipid panel also do not have on file for him.  Declined flu vaccine today.

## 2014-11-28 ENCOUNTER — Encounter: Payer: Self-pay | Admitting: Physician Assistant

## 2014-11-28 ENCOUNTER — Ambulatory Visit (INDEPENDENT_AMBULATORY_CARE_PROVIDER_SITE_OTHER): Payer: BC Managed Care – PPO | Admitting: Physician Assistant

## 2014-11-28 VITALS — BP 138/89 | HR 69 | Temp 97.9°F | Ht 70.0 in | Wt 210.0 lb

## 2014-11-28 DIAGNOSIS — R232 Flushing: Secondary | ICD-10-CM | POA: Diagnosis not present

## 2014-11-28 DIAGNOSIS — R5383 Other fatigue: Secondary | ICD-10-CM

## 2014-11-28 DIAGNOSIS — R110 Nausea: Secondary | ICD-10-CM | POA: Diagnosis not present

## 2014-11-28 LAB — GLUCOSE, POCT (MANUAL RESULT ENTRY): POC Glucose: 95 mg/dl (ref 70–99)

## 2014-11-28 NOTE — Progress Notes (Signed)
Subjective:    Patient ID: Warren Rivas, male    DOB: 1971/04/25, 43 y.o.   MRN: [id number]  HPI  Pt is a 43 yo male who presents to the clinic with fatigue, facial flushing, nausea for last 2 weeks. Pt reports that acutely he just feels really tired. He usually likes to bike and go to the gym and all he can do is go home and sleep. Sunday had a episode of all day nausea but no vomiting. No abdominal pain. Food does not bother him. No bowel changes. No fever, chills, rash. He did stop ADD medication thinking it could be making wors.e     Review of Systems  All other systems reviewed and are negative.      Objective:   Physical Exam  Constitutional: He is oriented to person, place, and time. He appears well-developed and well-nourished.  HENT:  Head: Normocephalic and atraumatic.  Right Ear: External ear normal.  Left Ear: External ear normal.  Nose: Nose normal.  Mouth/Throat: Oropharynx is clear and moist.  Eyes: Conjunctivae are normal.  Neck: Normal range of motion. Neck supple.  Cardiovascular: Normal rate, regular rhythm and normal heart sounds.   Pulmonary/Chest: Effort normal and breath sounds normal. He has no wheezes.  Abdominal: Soft. Bowel sounds are normal. He exhibits no distension and no mass. There is no tenderness. There is no rebound and no guarding.  Neurological: He is alert and oriented to person, place, and time.  Skin: Skin is dry.  Psychiatric: He has a normal mood and affect. His behavior is normal.          Assessment & Plan:  Fatigue/facial flushing/nausea-unclear etiology. No signs of infection today. Vitals look great. Will get labs to look for mono or CMV, cbc, TSH, testosterone. Encouraged pt to start back on Adderall. Being off could make him more pseudo tired being used to stimulant.  . Results for orders placed or performed in visit on 11/28/14  TSH  Result Value Ref Range   TSH 1.462 0.350 - 4.500 uIU/mL  CBC w/Diff  Result Value  Ref Range   WBC 5.5 4.0 - 10.5 K/uL   RBC 5.34 4.22 - 5.81 MIL/uL   Hemoglobin 15.5 13.0 - 17.0 g/dL   HCT 44.8 39.0 - 52.0 %   MCV 83.9 78.0 - 100.0 fL   MCH 29.0 26.0 - 34.0 pg   MCHC 34.6 30.0 - 36.0 g/dL   RDW 14.1 11.5 - 15.5 %   Platelets 217 150 - 400 K/uL   MPV 9.7 9.4 - 12.4 fL   Neutrophils Relative % 57 43 - 77 %   Neutro Abs 3.1 1.7 - 7.7 K/uL   Lymphocytes Relative 32 12 - 46 %   Lymphs Abs 1.8 0.7 - 4.0 K/uL   Monocytes Relative 9 3 - 12 %   Monocytes Absolute 0.5 0.1 - 1.0 K/uL   Eosinophils Relative 1 0 - 5 %   Eosinophils Absolute 0.1 0.0 - 0.7 K/uL   Basophils Relative 1 0 - 1 %   Basophils Absolute 0.1 0.0 - 0.1 K/uL   Smear Review Criteria for review not met   Testosterone, free, total  Result Value Ref Range   Testosterone 445 300 - 890 ng/dL   Sex Hormone Binding 29 13 - 71 nmol/L   Testosterone, Free 98.7 47.0 - 244.0 pg/mL   Testosterone-% Free 2.2 1.6 - 2.9 %  COMPLETE METABOLIC PANEL WITH GFR  Result Value Ref Range  Sodium 139 135 - 145 mEq/L   Potassium 4.8 3.5 - 5.3 mEq/L   Chloride 102 96 - 112 mEq/L   CO2 26 19 - 32 mEq/L   Glucose, Bld 109 (H) 70 - 99 mg/dL   BUN 11 6 - 23 mg/dL   Creat 0.90 0.50 - 1.35 mg/dL   Total Bilirubin 0.4 0.2 - 1.2 mg/dL   Alkaline Phosphatase 69 39 - 117 U/L   AST 35 0 - 37 U/L   ALT 50 0 - 53 U/L   Total Protein 6.4 6.0 - 8.3 g/dL   Albumin 4.2 3.5 - 5.2 g/dL   Calcium 8.8 8.4 - 10.5 mg/dL   GFR, Est African American >89 mL/min   GFR, Est Non African American >89 mL/min  Hemoglobin A1c  Result Value Ref Range   Hgb A1c MFr Bld 6.1 (H) <5.7 %   Mean Plasma Glucose 128 (H) <117 mg/dL  Cytomegalovirus antibody, IgG  Result Value Ref Range   Cytomegalovirus Ab-IgG 3.40 (H) <0.60 U/mL  Epstein-Barr virus VCA, IgG  Result Value Ref Range   EBV VCA IgG 274.0 (H) <18.0 U/mL  Epstein-Barr virus VCA, IgM  Result Value Ref Range   EBV VCA IgM <10.0 <36.0 U/mL  CMV IgM  Result Value Ref Range   CMV IgM <8.00  <30.00 AU/mL  H. pylori antibody, IgG  Result Value Ref Range   H Pylori IgG <0.40 ISR  POCT Glucose (CBG)  Result Value Ref Range   POC Glucose 95 70 - 99 mg/dl      Call with any changing or new symptoms.   Random glucose was good. A1c(pre-diabetes). Consider small meals to avoid any sugar lows.

## 2014-11-28 NOTE — Patient Instructions (Signed)
Hypoglycemia °Hypoglycemia occurs when the glucose in your blood is too low. Glucose is a type of sugar that is your body's main energy source. Hormones, such as insulin and glucagon, control the level of glucose in the blood. Insulin lowers blood glucose and glucagon increases blood glucose. Having too much insulin in your blood stream, or not eating enough food containing sugar, can result in hypoglycemia. Hypoglycemia can happen to people with or without diabetes. It can develop quickly and can be a medical emergency.  °CAUSES  °· Missing or delaying meals. °· Not eating enough carbohydrates at meals. °· Taking too much diabetes medicine. °· Not timing your oral diabetes medicine or insulin doses with meals, snacks, and exercise. °· Nausea and vomiting. °· Certain medicines. °· Severe illnesses, such as hepatitis, kidney disorders, and certain eating disorders. °· Increased activity or exercise without eating something extra or adjusting medicines. °· Drinking too much alcohol. °· A nerve disorder that affects body functions like your heart rate, blood pressure, and digestion (autonomic neuropathy). °· A condition where the stomach muscles do not function properly (gastroparesis). Therefore, medicines and food may not absorb properly. °· Rarely, a tumor of the pancreas can produce too much insulin. °SYMPTOMS  °· Hunger. °· Sweating (diaphoresis). °· Change in body temperature. °· Shakiness. °· Headache. °· Anxiety. °· Lightheadedness. °· Irritability. °· Difficulty concentrating. °· Dry mouth. °· Tingling or numbness in the hands or feet. °· Restless sleep or sleep disturbances. °· Altered speech and coordination. °· Change in mental status. °· Seizures or prolonged convulsions. °· Combativeness. °· Drowsiness (lethargic). °· Weakness. °· Increased heart rate or palpitations. °· Confusion. °· Pale, gray skin color. °· Blurred or double vision. °· Fainting. °DIAGNOSIS  °A physical exam and medical history will be  performed. Your caregiver may make a diagnosis based on your symptoms. Blood tests and other lab tests may be performed to confirm a diagnosis. Once the diagnosis is made, your caregiver will see if your signs and symptoms go away once your blood glucose is raised.  °TREATMENT  °Usually, you can easily treat your hypoglycemia when you notice symptoms. °· Check your blood glucose. If it is less than 70 mg/dl, take one of the following:   °¨ 3-4 glucose tablets.   °¨ ½ cup juice.   °¨ ½ cup regular soda.   °¨ 1 cup skim milk.   °¨ ½-1 tube of glucose gel.   °¨ 5-6 hard candies.   °· Avoid high-fat drinks or food that may delay a rise in blood glucose levels. °· Do not take more than the recommended amount of sugary foods, drinks, gel, or tablets. Doing so will cause your blood glucose to go too high.   °· Wait 10-15 minutes and recheck your blood glucose. If it is still less than 70 mg/dl or below your target range, repeat treatment.   °· Eat a snack if it is more than 1 hour until your next meal.   °There may be a time when your blood glucose may go so low that you are unable to treat yourself at home when you start to notice symptoms. You may need someone to help you. You may even faint or be unable to swallow. If you cannot treat yourself, someone will need to bring you to the hospital.  °HOME CARE INSTRUCTIONS °· If you have diabetes, follow your diabetes management plan by: °¨ Taking your medicines as directed. °¨ Following your exercise plan. °¨ Following your meal plan. Do not skip meals. Eat on time. °¨ Testing your blood   glucose regularly. Check your blood glucose before and after exercise. If you exercise longer or different than usual, be sure to check blood glucose more frequently. °¨ Wearing your medical alert jewelry that says you have diabetes. °· Identify the cause of your hypoglycemia. Then, develop ways to prevent the recurrence of hypoglycemia. °· Do not take a hot bath or shower right after an  insulin shot. °· Always carry treatment with you. Glucose tablets are the easiest to carry. °· If you are going to drink alcohol, drink it only with meals. °· Tell friends or family members ways to keep you safe during a seizure. This may include removing hard or sharp objects from the area or turning you on your side. °· Maintain a healthy weight. °SEEK MEDICAL CARE IF:  °· You are having problems keeping your blood glucose in your target range. °· You are having frequent episodes of hypoglycemia. °· You feel you might be having side effects from your medicines. °· You are not sure why your blood glucose is dropping so low. °· You notice a change in vision or a new problem with your vision. °SEEK IMMEDIATE MEDICAL CARE IF:  °· Confusion develops. °· A change in mental status occurs. °· The inability to swallow develops. °· Fainting occurs. °Document Released: 12/09/2005 Document Revised: 12/14/2013 Document Reviewed: 04/06/2012 °[employer]® Patient Information ©2015 [employer]. This information is not intended to replace advice given to you by your health care provider. Make sure you discuss any questions you have with your health care provider. ° °

## 2014-11-29 LAB — CBC WITH DIFFERENTIAL/PLATELET
BASOS PCT: 1 % (ref 0–1)
Basophils Absolute: 0.1 10*3/uL (ref 0.0–0.1)
EOS ABS: 0.1 10*3/uL (ref 0.0–0.7)
EOS PCT: 1 % (ref 0–5)
HEMATOCRIT: 44.8 % (ref 39.0–52.0)
HEMOGLOBIN: 15.5 g/dL (ref 13.0–17.0)
LYMPHS PCT: 32 % (ref 12–46)
Lymphs Abs: 1.8 10*3/uL (ref 0.7–4.0)
MCH: 29 pg (ref 26.0–34.0)
MCHC: 34.6 g/dL (ref 30.0–36.0)
MCV: 83.9 fL (ref 78.0–100.0)
MONOS PCT: 9 % (ref 3–12)
MPV: 9.7 fL (ref 9.4–12.4)
Monocytes Absolute: 0.5 10*3/uL (ref 0.1–1.0)
NEUTROS ABS: 3.1 10*3/uL (ref 1.7–7.7)
Neutrophils Relative %: 57 % (ref 43–77)
Platelets: 217 10*3/uL (ref 150–400)
RBC: 5.34 MIL/uL (ref 4.22–5.81)
RDW: 14.1 % (ref 11.5–15.5)
WBC: 5.5 10*3/uL (ref 4.0–10.5)

## 2014-11-29 LAB — HEMOGLOBIN A1C
Hgb A1c MFr Bld: 6.1 % — ABNORMAL HIGH (ref <5.7)
Mean Plasma Glucose: 128 mg/dL — ABNORMAL HIGH (ref <117)

## 2014-11-30 ENCOUNTER — Encounter: Payer: Self-pay | Admitting: Physician Assistant

## 2014-11-30 DIAGNOSIS — R7303 Prediabetes: Secondary | ICD-10-CM | POA: Insufficient documentation

## 2014-11-30 LAB — COMPLETE METABOLIC PANEL WITH GFR
ALK PHOS: 69 U/L (ref 39–117)
ALT: 50 U/L (ref 0–53)
AST: 35 U/L (ref 0–37)
Albumin: 4.2 g/dL (ref 3.5–5.2)
BUN: 11 mg/dL (ref 6–23)
CALCIUM: 8.8 mg/dL (ref 8.4–10.5)
CHLORIDE: 102 meq/L (ref 96–112)
CO2: 26 mEq/L (ref 19–32)
CREATININE: 0.9 mg/dL (ref 0.50–1.35)
GFR, Est Non African American: >89 mL/min
Glucose, Bld: 109 mg/dL — ABNORMAL HIGH (ref 70–99)
POTASSIUM: 4.8 meq/L (ref 3.5–5.3)
Sodium: 139 mEq/L (ref 135–145)
Total Bilirubin: 0.4 mg/dL (ref 0.2–1.2)
Total Protein: 6.4 g/dL (ref 6.0–8.3)

## 2014-11-30 LAB — EPSTEIN-BARR VIRUS VCA, IGM

## 2014-11-30 LAB — H. PYLORI ANTIBODY, IGG: H Pylori IgG: <0.4 {ISR}

## 2014-11-30 LAB — TESTOSTERONE, FREE, TOTAL, SHBG
SEX HORMONE BINDING: 29 nmol/L (ref 13–71)
TESTOSTERONE: 445 ng/dL (ref 300–890)
Testosterone, Free: 98.7 pg/mL (ref 47.0–244.0)
Testosterone-% Free: 2.2 % (ref 1.6–2.9)

## 2014-11-30 LAB — CYTOMEGALOVIRUS ANTIBODY, IGG: CYTOMEGALOVIRUS AB-IGG: 3.4 U/mL — AB (ref <0.60)

## 2014-11-30 LAB — TSH: TSH: 1.462 u[IU]/mL (ref 0.350–4.500)

## 2014-11-30 LAB — EPSTEIN-BARR VIRUS VCA, IGG: EBV VCA IgG: 274 U/mL — ABNORMAL HIGH (ref <18.0)

## 2014-11-30 LAB — CMV IGM: CMV IgM: <8 AU/mL (ref <30.00)

## 2015-06-12 ENCOUNTER — Other Ambulatory Visit: Payer: Self-pay | Admitting: *Deleted

## 2015-06-12 MED ORDER — AMPHETAMINE-DEXTROAMPHET ER 25 MG PO CP24: 25.0000 mg | ORAL_CAPSULE | ORAL | Status: DC

## 2015-06-30 ENCOUNTER — Ambulatory Visit: Payer: Self-pay | Admitting: Family Medicine

## 2015-08-15 ENCOUNTER — Encounter: Payer: Self-pay | Admitting: Family Medicine

## 2015-08-15 ENCOUNTER — Ambulatory Visit (INDEPENDENT_AMBULATORY_CARE_PROVIDER_SITE_OTHER): Payer: BLUE CROSS/BLUE SHIELD | Admitting: Family Medicine

## 2015-08-15 VITALS — BP 100/69 | HR 65 | Ht 70.0 in | Wt 216.0 lb

## 2015-08-15 DIAGNOSIS — R7309 Other abnormal glucose: Secondary | ICD-10-CM | POA: Diagnosis not present

## 2015-08-15 DIAGNOSIS — F909 Attention-deficit hyperactivity disorder, unspecified type: Secondary | ICD-10-CM

## 2015-08-15 DIAGNOSIS — R7301 Impaired fasting glucose: Secondary | ICD-10-CM

## 2015-08-15 DIAGNOSIS — R7303 Prediabetes: Secondary | ICD-10-CM

## 2015-08-15 DIAGNOSIS — F988 Other specified behavioral and emotional disorders with onset usually occurring in childhood and adolescence: Secondary | ICD-10-CM

## 2015-08-15 LAB — POCT GLYCOSYLATED HEMOGLOBIN (HGB A1C): Hemoglobin A1C: 5.6

## 2015-08-15 MED ORDER — AMPHETAMINE-DEXTROAMPHET ER 25 MG PO CP24: 25.0000 mg | ORAL_CAPSULE | ORAL | Status: DC

## 2015-08-15 NOTE — Progress Notes (Addendum)
   Subjective:    Patient ID: Warren Rivas, male    DOB: 13-Sep-1971, 44 y.o.   MRN: [id number]  HPI ADD- doing well on Adderall XR 25 mg. He is not having any problems or side effects. No chest pain palpitations or palms with insomnia. He would like a refill for 90 days again.  IFG - last A1c in December was 6.1. He was called and discussed dietary and exercise changes.Says diet is not optimal.   Review of Systems     Objective:   Physical Exam  Constitutional: He is oriented to person, place, and time. He appears well-developed and well-nourished.  HENT:  Head: Normocephalic and atraumatic.  Cardiovascular: Normal rate, regular rhythm and normal heart sounds.   Pulmonary/Chest: Effort normal and breath sounds normal.  Neurological: He is alert and oriented to person, place, and time.  Skin: Skin is warm and dry.  Psychiatric: He has a normal mood and affect. His behavior is normal.         Assessment & Plan:  ADD- doing well on current regimen. Blood pressures at goal. No symptomatic concerns from the medication. Follow-up in 4 months.  IFG - well controlled. A1C is 5.6.  Great improvement. Recheck in 6-12 months.

## 2015-11-15 ENCOUNTER — Other Ambulatory Visit: Payer: Self-pay

## 2015-11-15 MED ORDER — AMPHETAMINE-DEXTROAMPHET ER 25 MG PO CP24: 25.0000 mg | ORAL_CAPSULE | ORAL | Status: DC

## 2015-12-15 ENCOUNTER — Other Ambulatory Visit: Payer: Self-pay | Admitting: Family Medicine

## 2015-12-15 MED ORDER — AMPHETAMINE-DEXTROAMPHET ER 25 MG PO CP24: 25.0000 mg | ORAL_CAPSULE | ORAL | Status: DC

## 2015-12-15 NOTE — Telephone Encounter (Signed)
Scheduled Pt for follow up with PCP.

## 2016-01-16 ENCOUNTER — Encounter: Payer: Self-pay | Admitting: Family Medicine

## 2016-01-16 ENCOUNTER — Ambulatory Visit (INDEPENDENT_AMBULATORY_CARE_PROVIDER_SITE_OTHER): Payer: BLUE CROSS/BLUE SHIELD | Admitting: Family Medicine

## 2016-01-16 VITALS — BP 134/79 | HR 75 | Wt 209.0 lb

## 2016-01-16 DIAGNOSIS — F909 Attention-deficit hyperactivity disorder, unspecified type: Secondary | ICD-10-CM

## 2016-01-16 DIAGNOSIS — F988 Other specified behavioral and emotional disorders with onset usually occurring in childhood and adolescence: Secondary | ICD-10-CM

## 2016-01-16 DIAGNOSIS — R7303 Prediabetes: Secondary | ICD-10-CM

## 2016-01-16 MED ORDER — AMPHETAMINE-DEXTROAMPHET ER 25 MG PO CP24: 25.0000 mg | ORAL_CAPSULE | ORAL | Status: DC

## 2016-01-16 NOTE — Progress Notes (Signed)
   Subjective:    Patient ID: Warren Rivas, male    DOB: 09-22-1971, 45 y.o.   MRN: [id number]  HPI ADD - doing well on his Adderall 25 mg.  No CP, SOB, or palpitations. No insomnia.  He is happy with his current regimen and feels like it's effective. He's been taking it daily.    Fasting glucose-He has lost 7 lbs.  Has been exercising.  Says he is eating better.    Review of Systems     Objective:   Physical Exam  Constitutional: He is oriented to person, place, and time. He appears well-developed and well-nourished.  HENT:  Head: Normocephalic and atraumatic.  Cardiovascular: Normal rate, regular rhythm and normal heart sounds.   Pulmonary/Chest: Effort normal and breath sounds normal.  Neurological: He is alert and oriented to person, place, and time.  Skin: Skin is warm and dry.  Psychiatric: He has a normal mood and affect. His behavior is normal.          Assessment & Plan:  ADD- controlled. Continue current regimen. 90 day supply provided. Follow-up in 4 months.  IFG - will recheck him 11 A1c next time I see him since he is working on weight loss.

## 2016-01-24 ENCOUNTER — Telehealth: Payer: Self-pay | Admitting: Family Medicine

## 2016-01-24 NOTE — Telephone Encounter (Signed)
Pt called to inform office, PCP wrote his last Adderall Rx for 90 day supply but he only had enough cash to get a 30day supply. Pharmacy will need an updated Rx when its time for his next fill. Advised Pt to contact our office near the one month mark (02/16/16) to get updated Rx. Have not verified with Pharmacy, will need to do that before new Rx is written.

## 2016-02-16 ENCOUNTER — Other Ambulatory Visit: Payer: Self-pay | Admitting: *Deleted

## 2016-02-16 MED ORDER — AMPHETAMINE-DEXTROAMPHET ER 25 MG PO CP24: 25.0000 mg | ORAL_CAPSULE | ORAL | Status: DC

## 2016-03-18 ENCOUNTER — Telehealth: Payer: Self-pay | Admitting: *Deleted

## 2016-03-18 MED ORDER — AMPHETAMINE-DEXTROAMPHET ER 25 MG PO CP24: 25.0000 mg | ORAL_CAPSULE | ORAL | Status: DC

## 2016-03-18 NOTE — Telephone Encounter (Signed)
Pt informed that rx will be up front for p/u.Marguis Mathieson Lynetta  

## 2016-06-26 ENCOUNTER — Other Ambulatory Visit: Payer: Self-pay | Admitting: *Deleted

## 2016-06-26 MED ORDER — AMPHETAMINE-DEXTROAMPHET ER 25 MG PO CP24: 25.0000 mg | ORAL_CAPSULE | ORAL | Status: DC

## 2016-12-19 ENCOUNTER — Encounter: Payer: Self-pay | Admitting: *Deleted

## 2016-12-19 ENCOUNTER — Emergency Department
Admission: EM | Admit: 2016-12-19 | Discharge: 2016-12-19 | Disposition: A | Discharge status: Home / Self Care | Payer: BLUE CROSS/BLUE SHIELD | Source: Home / Self Care | Attending: Family Medicine | Admitting: Family Medicine

## 2016-12-19 DIAGNOSIS — H7292 Unspecified perforation of tympanic membrane, left ear: Secondary | ICD-10-CM | POA: Diagnosis not present

## 2016-12-19 DIAGNOSIS — H6692 Otitis media, unspecified, left ear: Secondary | ICD-10-CM | POA: Diagnosis not present

## 2016-12-19 DIAGNOSIS — J069 Acute upper respiratory infection, unspecified: Secondary | ICD-10-CM

## 2016-12-19 DIAGNOSIS — B9789 Other viral agents as the cause of diseases classified elsewhere: Secondary | ICD-10-CM

## 2016-12-19 MED ORDER — IBUPROFEN 600 MG PO TABS
600.0000 mg | ORAL_TABLET | Freq: Once | ORAL | Status: AC
  Administered 2016-12-19: 600 mg via ORAL

## 2016-12-19 MED ORDER — PREDNISONE 20 MG PO TABS: ORAL_TABLET | ORAL | 0 refills | Status: DC

## 2016-12-19 MED ORDER — BENZONATATE 200 MG PO CAPS: ORAL_CAPSULE | ORAL | 0 refills | Status: DC

## 2016-12-19 MED ORDER — IBUPROFEN 200 MG PO TABS: 200.0000 mg | ORAL_TABLET | Freq: Once | ORAL | Status: DC

## 2016-12-19 MED ORDER — AMOXICILLIN-POT CLAVULANATE 875-125 MG PO TABS: 1.0000 | ORAL_TABLET | Freq: Two times a day (BID) | ORAL | 0 refills | Status: DC

## 2016-12-19 NOTE — ED Provider Notes (Signed)
[facility]    CSN: [id number] Arrival date & time: 12/19/16  1733     History   Chief Complaint Chief Complaint  Patient presents with  . Otalgia    HPI Warren Rivas is a 45 y.o. male.   Patient complains of one week history of typical cold-like symptoms developing over several days, including mild sore throat, sinus congestion, headache, and cough.  This morning he blew his nose and his left ear "popped."  He subsequently noticed blood and fluid from his left ear with an increase in left earache.  No fever.   The history is provided by the patient.    History reviewed. No pertinent past medical history.  Patient Active Problem List   Diagnosis Date Noted  . Pre-diabetes 11/30/2014  . Attention deficit disorder 10/20/2006    History reviewed. No pertinent surgical history.     Home Medications    Prior to Admission medications   Medication Sig Start Date End Date Taking? Authorizing Provider  amoxicillin-clavulanate (AUGMENTIN) 875-125 MG tablet Take 1 tablet by mouth 2 (two) times daily. Take with food 12/19/16   Lattie HawStephen A Crixus Mcaulay, MD  benzonatate (TESSALON) 200 MG capsule Take one cap by mouth at bedtime as needed for cough.  May repeat in 4 to 6 hours 12/19/16   Lattie HawStephen A Tamecia Mcdougald, MD  predniSONE (DELTASONE) 20 MG tablet Take one tab by mouth twice daily for 5 days, then one daily. Take with food. 12/19/16   Lattie HawStephen A Sarajane Fambrough, MD    Family History Family History  Problem Relation Age of Onset  . Cancer Father     lung    Social History Social History  Substance Use Topics  . Smoking status: Former Games developermoker  . Smokeless tobacco: Never Used  . Alcohol use No     Allergies   Patient has no known allergies.   Review of Systems Review of Systems + sore throat + cough No pleuritic pain No wheezing + nasal congestion + post-nasal drainage + sinus pain/pressure No itchy/red eyes + earache No hemoptysis No SOB No fever/chills No  nausea No vomiting No abdominal pain No diarrhea No urinary symptoms No skin rash + fatigue No myalgias No headache Used OTC meds without relief   Physical Exam Triage Vital Signs ED Triage Vitals  Enc Vitals Group     BP 12/19/16 1825 149/96     Pulse Rate 12/19/16 1825 74     Resp 12/19/16 1825 16     Temp 12/19/16 1825 97.9 F (36.6 C)     Temp Source 12/19/16 1825 Oral     SpO2 12/19/16 1825 98 %     Weight 12/19/16 1825 224 lb (101.6 kg)     Height 12/19/16 1825 5\' 10"  (1.778 m)     Head Circumference --      Peak Flow --      Pain Score 12/19/16 1826 8     Pain Loc --      Pain Edu? --      Excl. in GC? --    No data found.   Updated Vital Signs BP 149/96 (BP Location: Left Arm)   Pulse 74   Temp 97.9 F (36.6 C) (Oral)   Resp 16   Ht 5\' 10"  (1.778 m)   Wt 224 lb (101.6 kg)   SpO2 98%   BMI 32.14 kg/m   Visual Acuity Right Eye Distance:   Left Eye Distance:   Bilateral Distance:  Right Eye Near:   Left Eye Near:    Bilateral Near:     Physical Exam Nursing notes and Vital Signs reviewed. Appearance:  Patient appears stated age, and in no acute distress Eyes:  Pupils are equal, round, and reactive to light and accomodation.  Extraocular movement is intact.  Conjunctivae are not inflamed  Ears:  Canals normal.  Right tympanic membrane normal.  Left tympanic membrane erythematous with perforation present.    Nose:  Congested turbinates.  No sinus tenderness.    Pharynx:  Normal Neck:  Supple.  Tender enlarged posterior/lateral nodes are palpated bilaterally  Lungs:  Clear to auscultation.  Breath sounds are equal.  Moving air well. Heart:  Regular rate and rhythm without murmurs, rubs, or gallops.  Abdomen:  Nontender without masses or hepatosplenomegaly.  Bowel sounds are present.  No CVA or flank tenderness.  Extremities:  No edema.  Skin:  No rash present.    UC Treatments / Results  Labs (all labs ordered are listed, but only abnormal  results are displayed) Labs Reviewed - No data to display  EKG  EKG Interpretation None       Radiology No results found.  Procedures Procedures (including critical care time)  Medications Ordered in UC Medications  ibuprofen (ADVIL,MOTRIN) tablet 600 mg (600 mg Oral Given 12/19/16 1822)     Initial Impression / Assessment and Plan / UC Course  I have reviewed the triage vital signs and the nursing notes.  Pertinent labs & imaging results that were available during my care of the patient were reviewed by me and considered in my medical decision making (see chart for details).  Clinical Course   Begin Augmentin, and prednisone burst/taper. Prescription written for Benzonatate [facility](Tessalon) to take at bedtime for night-time cough.  Take plain guaifenesin (1200mg  extended release tabs such as Mucinex) twice daily, with plenty of water, for cough and congestion.  May add Pseudoephedrine (30mg , one or two every 4 to 6 hours) for sinus congestion.  Get adequate rest.   May use Afrin nasal spray (or generic oxymetazoline) twice daily for about 5 days and then discontinue.  Also recommend using saline nasal spray several times daily and saline nasal irrigation (AYR is a common brand).  Use Flonase nasal spray each morning after using Afrin nasal spray and saline nasal irrigation. Stop all antihistamines for now, and other non-prescription cough/cold preparations. Followup with ENT in about one week.     Final Clinical Impressions(s) / UC Diagnoses   Final diagnoses:  Left otitis media with spontaneous rupture of eardrum  Viral URI with cough    New Prescriptions New Prescriptions   AMOXICILLIN-CLAVULANATE (AUGMENTIN) 875-125 MG TABLET    Take 1 tablet by mouth 2 (two) times daily. Take with food   BENZONATATE (TESSALON) 200 MG CAPSULE    Take one cap by mouth at bedtime as needed for cough.  May repeat in 4 to 6 hours   PREDNISONE (DELTASONE) 20 MG TABLET    Take one tab by mouth  twice daily for 5 days, then one daily. Take with food.     Lattie HawStephen A Vianey Caniglia, MD 01/05/17 1304

## 2016-12-19 NOTE — ED Triage Notes (Signed)
Pt c/o LT ear pain x today. He reports that he has a cold, he blew his nose and his ear pop this morning that started the pain. He reports blood and fluid in his ear canal this afternoon with an increase in the pain. He took IBF at 1100 today.

## 2016-12-19 NOTE — Discharge Instructions (Signed)
Take plain guaifenesin (1200mg extended release tabs such as Mucinex) twice daily, with plenty of water, for cough and congestion.  May add Pseudoephedrine (30mg, one or two every 4 to 6 hours) for sinus congestion.  Get adequate rest.   °May use Afrin nasal spray (or generic oxymetazoline) twice daily for about 5 days and then discontinue.  Also recommend using saline nasal spray several times daily and saline nasal irrigation (AYR is a common brand).  Use Flonase nasal spray each morning after using Afrin nasal spray and saline nasal irrigation. °Stop all antihistamines for now, and other non-prescription cough/cold preparations. °  °  °

## 2017-02-11 ENCOUNTER — Encounter: Payer: Self-pay | Admitting: Family Medicine

## 2017-02-11 ENCOUNTER — Ambulatory Visit (INDEPENDENT_AMBULATORY_CARE_PROVIDER_SITE_OTHER): Payer: BLUE CROSS/BLUE SHIELD | Admitting: Family Medicine

## 2017-02-11 VITALS — BP 123/67 | HR 85 | Ht 70.0 in | Wt 223.0 lb

## 2017-02-11 DIAGNOSIS — R7303 Prediabetes: Secondary | ICD-10-CM

## 2017-02-11 DIAGNOSIS — Z13220 Encounter for screening for lipoid disorders: Secondary | ICD-10-CM

## 2017-02-11 DIAGNOSIS — F988 Other specified behavioral and emotional disorders with onset usually occurring in childhood and adolescence: Secondary | ICD-10-CM | POA: Diagnosis not present

## 2017-02-11 LAB — POCT GLYCOSYLATED HEMOGLOBIN (HGB A1C): Hemoglobin A1C: 6.4

## 2017-02-11 MED ORDER — AMPHETAMINE-DEXTROAMPHET ER 25 MG PO CP24: 25.0000 mg | ORAL_CAPSULE | ORAL | 0 refills | Status: DC

## 2017-02-11 NOTE — Progress Notes (Signed)
Subjective:    CC: ADD  HPI:  ADD- Patient currently has a diagnosis of ADD. He actually used to be on Adderall 25 mg extended release daily. Is not currently on any medication for treatment.He did well on the medication in the past and never experienced any chest pain, palpitations or insomnia on the medication. He also reports that he has not had any recent chest pain issues.  IFG - no increased thirst urination. Last A1c was in the normal range. As he has not been eating the best and he's been doing a lot of weight lifting at the gym. Lab Results  Component Value Date   HGBA1C 6.4 02/11/2017   Past medical history, Surgical history, Family history not pertinant except as noted below, Social history, Allergies, and medications have been entered into the medical record, reviewed, and corrections made.   Review of Systems: No fevers, chills, night sweats, weight loss, chest pain, or shortness of breath.   Objective:    General: Well Developed, well nourished, and in no acute distress.  Neuro: Alert and oriented x3, extra-ocular muscles intact, sensation grossly intact.  HEENT: Normocephalic, atraumatic  Skin: Warm and dry, no rashes. Cardiac: Regular rate and rhythm, no murmurs rubs or gallops, no lower extremity edema.  Respiratory: Clear to auscultation bilaterally. Not using accessory muscles, speaking in full sentences.   Impression and Recommendations:    ADD-  We'll restart Adderall 25 mg daily. He is asymptomatic and no recent chest pain and he never experienced any side effects on the medication previously. He was on Adderall 25 mg extended release. 90 day supply printed and given today.  IFG - A1c up to 6.4. Discussed getting back on track with diet and exercise and even considering restarting metformin early but rather than later. I'll see him back in 90 days of the week and recheck his A1c. Did encourage him to get a CMP and fasting lipid panel.  Due for screening lipid  panel.

## 2017-05-27 ENCOUNTER — Other Ambulatory Visit: Payer: Self-pay | Admitting: *Deleted

## 2017-05-27 ENCOUNTER — Telehealth: Payer: Self-pay | Admitting: Family Medicine

## 2017-05-27 MED ORDER — AMPHETAMINE-DEXTROAMPHET ER 25 MG PO CP24
25.0000 mg | ORAL_CAPSULE | ORAL | 0 refills | Status: DC
Start: 2017-05-27 — End: 2017-06-05

## 2017-05-27 NOTE — Telephone Encounter (Signed)
Pt called for refill on Adderall. He has an appointment for June 7th. Thank you.

## 2017-05-27 NOTE — Telephone Encounter (Signed)
rx will be ready in the morning pt informed.Warren PacasBarkley, Warren Rivas BreckenridgeLynetta

## 2017-05-29 ENCOUNTER — Ambulatory Visit: Payer: BLUE CROSS/BLUE SHIELD | Admitting: Family Medicine

## 2017-06-05 ENCOUNTER — Encounter: Payer: Self-pay | Admitting: Family Medicine

## 2017-06-05 ENCOUNTER — Ambulatory Visit (INDEPENDENT_AMBULATORY_CARE_PROVIDER_SITE_OTHER): Payer: BLUE CROSS/BLUE SHIELD | Admitting: Family Medicine

## 2017-06-05 VITALS — BP 118/75 | HR 65 | Ht 67.72 in | Wt 218.0 lb

## 2017-06-05 DIAGNOSIS — F988 Other specified behavioral and emotional disorders with onset usually occurring in childhood and adolescence: Secondary | ICD-10-CM | POA: Diagnosis not present

## 2017-06-05 DIAGNOSIS — R7303 Prediabetes: Secondary | ICD-10-CM | POA: Diagnosis not present

## 2017-06-05 LAB — POCT GLYCOSYLATED HEMOGLOBIN (HGB A1C): Hemoglobin A1C: 5.9

## 2017-06-05 MED ORDER — AMPHETAMINE-DEXTROAMPHET ER 25 MG PO CP24: 25.0000 mg | ORAL_CAPSULE | ORAL | 0 refills | Status: DC

## 2017-06-05 MED ORDER — AMPHETAMINE-DEXTROAMPHET ER 25 MG PO CP24
25.0000 mg | ORAL_CAPSULE | ORAL | 0 refills | Status: DC
Start: 2017-06-05 — End: 2017-09-24

## 2017-06-05 NOTE — Progress Notes (Signed)
   Subjective:    Patient ID: Warren Rivas, male    DOB: May 13, 1971, 46 y.o.   MRN: [id number]  HPI Follow-up for ADD. No recent chest pain shortness breath or palpitations on Adderall XR. Tolerating medication well without any side effects. Happy with current regimen of 25 mg extended release daily.  Impaired fasting glucose-no increased thirst or urination. No symptoms consistent with hypoglycemia. He has made some dietary changes and hips that his A1c is down. At the last visit it was 6.4. Also discussed possible addition of metformin.   Review of Systems     Objective:   Physical Exam  Constitutional: He is oriented to person, place, and time. He appears well-developed and well-nourished.  HENT:  Head: Normocephalic and atraumatic.  Cardiovascular: Normal rate, regular rhythm and normal heart sounds.   Pulmonary/Chest: Effort normal and breath sounds normal.  Neurological: He is alert and oriented to person, place, and time.  Skin: Skin is warm and dry.  Psychiatric: He has a normal mood and affect. His behavior is normal.          Assessment & Plan:  ADD - Doing well on current regimen. 90 day supply given. F/U in 4 months.    IFG - A!C of 5.9 today. Continue work on [employer] diet and continue to monitor. Plan to recheck again in 6 months.

## 2017-06-06 LAB — LIPID PANEL
Cholesterol: 186 mg/dL (ref <200)
HDL: 42 mg/dL (ref >40)
LDL CALC: 128 mg/dL — AB (ref <100)
Total CHOL/HDL Ratio: 4.4 Ratio (ref <5.0)
Triglycerides: 80 mg/dL (ref <150)
VLDL: 16 mg/dL (ref <30)

## 2017-06-06 LAB — COMPLETE METABOLIC PANEL WITH GFR
ALT: 26 U/L (ref 9–46)
AST: 28 U/L (ref 10–40)
Albumin: 4.5 g/dL (ref 3.6–5.1)
Alkaline Phosphatase: 68 U/L (ref 40–115)
BILIRUBIN TOTAL: 0.6 mg/dL (ref 0.2–1.2)
BUN: 11 mg/dL (ref 7–25)
CHLORIDE: 102 mmol/L (ref 98–110)
CO2: 26 mmol/L (ref 20–31)
CREATININE: 1.11 mg/dL (ref 0.60–1.35)
Calcium: 8.9 mg/dL (ref 8.6–10.3)
GFR, Est Non African American: 79 mL/min (ref >=60)
GLUCOSE: 120 mg/dL — AB (ref 65–99)
Potassium: 4 mmol/L (ref 3.5–5.3)
SODIUM: 136 mmol/L (ref 135–146)
TOTAL PROTEIN: 6.7 g/dL (ref 6.1–8.1)

## 2017-09-24 ENCOUNTER — Other Ambulatory Visit: Payer: Self-pay

## 2017-09-24 MED ORDER — AMPHETAMINE-DEXTROAMPHET ER 25 MG PO CP24
25.0000 mg | ORAL_CAPSULE | ORAL | 0 refills | Status: DC
Start: 2017-09-24 — End: 2017-10-14

## 2017-10-07 ENCOUNTER — Ambulatory Visit: Payer: BLUE CROSS/BLUE SHIELD | Admitting: Family Medicine

## 2017-10-07 NOTE — Progress Notes (Deleted)
   Subjective:    Patient ID: Warren Rivas, male    DOB: October 11, 1971, 46 y.o.   MRN: [id number]  HPI Four-month follow-up for ADD-   Review of Systems     Objective:   Physical Exam        Assessment & Plan:  ADD-

## 2017-10-14 ENCOUNTER — Encounter: Payer: Self-pay | Admitting: Family Medicine

## 2017-10-14 ENCOUNTER — Ambulatory Visit (INDEPENDENT_AMBULATORY_CARE_PROVIDER_SITE_OTHER): Payer: BLUE CROSS/BLUE SHIELD | Admitting: Family Medicine

## 2017-10-14 VITALS — BP 134/75 | HR 67 | Ht 68.0 in | Wt 218.0 lb

## 2017-10-14 DIAGNOSIS — F988 Other specified behavioral and emotional disorders with onset usually occurring in childhood and adolescence: Secondary | ICD-10-CM

## 2017-10-14 MED ORDER — AMPHETAMINE-DEXTROAMPHET ER 25 MG PO CP24: 25.0000 mg | ORAL_CAPSULE | ORAL | 0 refills | Status: DC

## 2017-10-14 NOTE — Progress Notes (Signed)
   Subjective:    Patient ID: Warren Rivas, male    DOB: 1971-06-23, 46 y.o.   MRN: [id number]  HPI Follow-up ADD-currently on Adderall 25 mg daily.  Taking medication without any side effects or concerns.  No chest pain, palpitations or significant insomnia.  Medication overall is working well.    Review of Systems     Objective:   Physical Exam  Constitutional: He is oriented to person, place, and time. He appears well-developed and well-nourished.  HENT:  Head: Normocephalic and atraumatic.  Cardiovascular: Normal rate, regular rhythm and normal heart sounds.   Pulmonary/Chest: Effort normal and breath sounds normal.  Neurological: He is alert and oriented to person, place, and time.  Skin: Skin is warm and dry.  Psychiatric: He has a normal mood and affect. His behavior is normal.       Assessment & Plan:  ADD-well on current regimen.  Refills provided for the next 4 months.  Did encourage him to check with his insurance to see if they will cover 90-day supply.  No specific concerns seen today and his blood pressure is well controlled.  To check hemoglobin A1c at next office visit.  Declined flu vaccine today.

## 2017-10-16 ENCOUNTER — Ambulatory Visit: Payer: BLUE CROSS/BLUE SHIELD | Admitting: Family Medicine

## 2018-01-20 ENCOUNTER — Other Ambulatory Visit: Payer: Self-pay | Admitting: Family Medicine

## 2018-01-20 MED ORDER — AMPHETAMINE-DEXTROAMPHET ER 25 MG PO CP24: 25.0000 mg | ORAL_CAPSULE | ORAL | 0 refills | Status: DC

## 2019-05-11 ENCOUNTER — Encounter: Payer: Self-pay | Admitting: Family Medicine

## 2019-05-11 ENCOUNTER — Other Ambulatory Visit: Payer: Self-pay

## 2019-05-11 ENCOUNTER — Ambulatory Visit (INDEPENDENT_AMBULATORY_CARE_PROVIDER_SITE_OTHER): Payer: BLUE CROSS/BLUE SHIELD | Admitting: Family Medicine

## 2019-05-11 VITALS — Temp 98.6°F | Ht 68.0 in | Wt 200.0 lb

## 2019-05-11 DIAGNOSIS — R7989 Other specified abnormal findings of blood chemistry: Secondary | ICD-10-CM | POA: Diagnosis not present

## 2019-05-11 DIAGNOSIS — R7303 Prediabetes: Secondary | ICD-10-CM | POA: Diagnosis not present

## 2019-05-11 DIAGNOSIS — F988 Other specified behavioral and emotional disorders with onset usually occurring in childhood and adolescence: Secondary | ICD-10-CM

## 2019-05-11 DIAGNOSIS — Z13220 Encounter for screening for lipoid disorders: Secondary | ICD-10-CM | POA: Diagnosis not present

## 2019-05-11 MED ORDER — AMPHETAMINE-DEXTROAMPHET ER 25 MG PO CP24: 25.0000 mg | ORAL_CAPSULE | ORAL | 0 refills | Status: DC

## 2019-05-11 NOTE — Progress Notes (Signed)
Virtual Visit via Video Note  I connected with Warren Rivas on 05/11/19 at  2:40 PM EDT by a video enabled telemedicine application and verified that I am speaking with the correct person using two identifiers.   I discussed the limitations of evaluation and management by telemedicine and the availability of in person appointments. The patient expressed understanding and agreed to proceed.  He is sitting in his car and I was in my home office for this visit.    Subjective:    CC: Wants to restart ADD medication.   HPI: 48 year old male is here for follow-up ADD.  Last time I saw him was October 2018.  He decided to come off of his medication but says he has been really struggling since he has been off of it for well over a year.  He was previously on Adderall 25 mg each morning.  He did not experience any side effects with medication such as chest pain or palpitations.  He also has a history of prediabetes.  He had an abnormal glucose a couple of years ago and so we did a hemoglobin A1c in June 2018 which was 5.9 and consistent with impaired fasting glucose.  He has not had a chance to check it again since then since he has not been back in the office.He denies any inc thirst or urination.    Past medical history, Surgical history, Family history not pertinant except as noted below, Social history, Allergies, and medications have been entered into the medical record, reviewed, and corrections made.   Review of Systems: No fevers, chills, night sweats, weight loss, chest pain, or shortness of breath.   Objective:    General: Speaking clearly in complete sentences without any shortness of breath.  Alert and oriented x3.  Normal judgment. No apparent acute distress. Well groomed.     Impression and Recommendations:    ADD-I be happy to restart his regimen.  He has taken it previously and did not have any problems or side effects.  Plan to follow-up in 4 months if he decides to stick  with it.stop immediately if any CP or symptoms.    IFG - due to recheck A1C.   Due for screening lipid.   Elevated creatinine -level was elevated in 2018. Plan was to recheck in 6 months.        I discussed the assessment and treatment plan with the patient. The patient was provided an opportunity to ask questions and all were answered. The patient agreed with the plan and demonstrated an understanding of the instructions.   The patient was advised to call back or seek an in-person evaluation if the symptoms worsen or if the condition fails to improve as anticipated.   Nani Gasser, MD

## 2019-05-11 NOTE — Progress Notes (Signed)
Pt would like to restart this medication. He tried to go with\out taking this medication however, he cannot. Laureen Ochs, Viann Shove, CMA

## 2019-08-06 ENCOUNTER — Other Ambulatory Visit: Payer: Self-pay

## 2019-08-06 DIAGNOSIS — F988 Other specified behavioral and emotional disorders with onset usually occurring in childhood and adolescence: Secondary | ICD-10-CM

## 2019-08-06 MED ORDER — AMPHETAMINE-DEXTROAMPHET ER 25 MG PO CP24: 25.0000 mg | ORAL_CAPSULE | ORAL | 0 refills | Status: DC

## 2019-08-06 NOTE — Telephone Encounter (Signed)
Patient is requesting a refill on Adderall. He is due for a follow up appointment. I am sending a message to the front to call to schedule for follow up on ADD.

## 2019-08-06 NOTE — Telephone Encounter (Signed)
I have scheduled with patient

## 2019-08-09 ENCOUNTER — Ambulatory Visit (INDEPENDENT_AMBULATORY_CARE_PROVIDER_SITE_OTHER): Payer: BC Managed Care – PPO | Admitting: Family Medicine

## 2019-08-09 ENCOUNTER — Encounter: Payer: Self-pay | Admitting: Family Medicine

## 2019-08-09 ENCOUNTER — Other Ambulatory Visit: Payer: Self-pay

## 2019-08-09 VITALS — BP 116/69 | HR 62 | Ht 68.0 in | Wt 216.0 lb

## 2019-08-09 DIAGNOSIS — F988 Other specified behavioral and emotional disorders with onset usually occurring in childhood and adolescence: Secondary | ICD-10-CM | POA: Diagnosis not present

## 2019-08-09 DIAGNOSIS — R7303 Prediabetes: Secondary | ICD-10-CM

## 2019-08-09 DIAGNOSIS — H00011 Hordeolum externum right upper eyelid: Secondary | ICD-10-CM | POA: Insufficient documentation

## 2019-08-09 MED ORDER — AMPHETAMINE-DEXTROAMPHET ER 25 MG PO CP24: 25.0000 mg | ORAL_CAPSULE | ORAL | 0 refills | Status: DC

## 2019-08-09 MED ORDER — ERYTHROMYCIN 5 MG/GM OP OINT: 1.0000 "application " | TOPICAL_OINTMENT | Freq: Three times a day (TID) | OPHTHALMIC | 0 refills | Status: DC

## 2019-08-09 NOTE — Progress Notes (Signed)
Established Patient Office Visit  Subjective:  Patient ID: Warren PenningMichael L Prosser, male    DOB: 1971/05/06  Age: 48 y.o. MRN: [id number]  CC:  Chief Complaint  Patient presents with  . ADD    doing well on current regimen, no problems w/sleep,appetite, and no palpitations  . Stye    R upper eyelid x 2 wks he has tried warm compresses and an ointment    HPI Warren Rivas presents for   ADD - Reports symptoms are well controlled on current regime. Denies any problems with insomnia, chest pain, palpitations, or SOB.    He also has a stye on his Right upper eyelid x 2 weeks. Has tried warm compresses some.  Also tries some ointment his sister gave him.   No past medical history on file.  No past surgical history on file.  Family History  Problem Relation Age of Onset  . Cancer Father        lung    Social History   Socioeconomic History  . Marital status: Married    Spouse name: Not on file  . Number of children: Not on file  . Years of education: Not on file  . Highest education level: Not on file  Occupational History  . Not on file  Social Needs  . Financial resource strain: Not on file  . Food insecurity    Worry: Not on file    Inability: Not on file  . Transportation needs    Medical: Not on file    Non-medical: Not on file  Tobacco Use  . Smoking status: Former Games developermoker  . Smokeless tobacco: Never Used  Substance and Sexual Activity  . Alcohol use: No  . Drug use: No  . Sexual activity: Not on file  Lifestyle  . Physical activity    Days per week: Not on file    Minutes per session: Not on file  . Stress: Not on file  Relationships  . Social Musicianconnections    Talks on phone: Not on file    Gets together: Not on file    Attends religious service: Not on file    Active member of club or organization: Not on file    Attends meetings of clubs or organizations: Not on file    Relationship status: Not on file  . Intimate partner violence    Fear of current  or ex partner: Not on file    Emotionally abused: Not on file    Physically abused: Not on file    Forced sexual activity: Not on file  Other Topics Concern  . Not on file  Social History Narrative  . Not on file    Outpatient Medications Prior to Visit  Medication Sig Dispense Refill  . amphetamine-dextroamphetamine (ADDERALL XR) 25 MG 24 hr capsule Take 1 capsule by mouth every morning. 30 capsule 0  . amphetamine-dextroamphetamine (ADDERALL XR) 25 MG 24 hr capsule Take 1 capsule by mouth every morning. 30 capsule 0  . amphetamine-dextroamphetamine (ADDERALL XR) 25 MG 24 hr capsule Take 1 capsule by mouth every morning. 30 capsule 0   No facility-administered medications prior to visit.     No Known Allergies  ROS Review of Systems    Objective:    Physical Exam  Constitutional: He is oriented to person, place, and time. He appears well-developed and well-nourished.  HENT:  Head: Normocephalic and atraumatic.  Eyes:  He does have a swollen pustule on the middle of the upper  lid near the lash line on the right eye.  Cardiovascular: Normal rate, regular rhythm and normal heart sounds.  Pulmonary/Chest: Effort normal and breath sounds normal.  Neurological: He is alert and oriented to person, place, and time.  Skin: Skin is warm and dry.  Psychiatric: He has a normal mood and affect. His behavior is normal.    BP 116/69   Pulse 62   Ht 5\' 8"  (1.727 m)   Wt 216 lb (98 kg)   SpO2 99%   BMI 32.84 kg/m  Wt Readings from Last 3 Encounters:  08/09/19 216 lb (98 kg)  05/11/19 200 lb (90.7 kg)  10/14/17 218 lb (98.9 kg)     There are no preventive care reminders to display for this patient.  There are no preventive care reminders to display for this patient.  Lab Results  Component Value Date   TSH 1.462 11/28/2014   Lab Results  Component Value Date   WBC 5.5 11/28/2014   HGB 15.5 11/28/2014   HCT 44.8 11/28/2014   MCV 83.9 11/28/2014   PLT 217 11/28/2014    Lab Results  Component Value Date   NA 136 06/05/2017   K 4.0 06/05/2017   CO2 26 06/05/2017   GLUCOSE 120 (H) 06/05/2017   BUN 11 06/05/2017   CREATININE 1.11 06/05/2017   BILITOT 0.6 06/05/2017   ALKPHOS 68 06/05/2017   AST 28 06/05/2017   ALT 26 06/05/2017   PROT 6.7 06/05/2017   ALBUMIN 4.5 06/05/2017   CALCIUM 8.9 06/05/2017   Lab Results  Component Value Date   CHOL 186 06/05/2017   Lab Results  Component Value Date   HDL 42 06/05/2017   Lab Results  Component Value Date   LDLCALC 128 (H) 06/05/2017   Lab Results  Component Value Date   TRIG 80 06/05/2017   Lab Results  Component Value Date   CHOLHDL 4.4 06/05/2017   Lab Results  Component Value Date   HGBA1C 5.9 06/05/2017      Assessment & Plan:   Problem List Items Addressed This Visit      Other   Pre-diabetes    Due to check A1C. He will go to the lab.       Hordeolum externum of right upper eyelid    Discussed warm compresses up to 3 times a day.  We will also prescribe erythromycin ophthalmic ointment to put in the eye and on the lid.  If not improving then will refer to optometry for further evaluation and possible I&D.      Relevant Orders   Ambulatory referral to Optometry   Attention deficit disorder - Primary    Well controlled. Continue current regimen. Follow up in  4 mo      Relevant Medications   amphetamine-dextroamphetamine (ADDERALL XR) 25 MG 24 hr capsule (Start on 11/03/2019)   amphetamine-dextroamphetamine (ADDERALL XR) 25 MG 24 hr capsule (Start on 10/04/2019)   amphetamine-dextroamphetamine (ADDERALL XR) 25 MG 24 hr capsule (Start on 09/05/2019)      Meds ordered this encounter  Medications  . erythromycin ophthalmic ointment    Sig: Place 1 application into the right eye 3 (three) times daily.    Dispense:  3.5 g    Refill:  0  . amphetamine-dextroamphetamine (ADDERALL XR) 25 MG 24 hr capsule    Sig: Take 1 capsule by mouth every morning.    Dispense:  30  capsule    Refill:  0  . amphetamine-dextroamphetamine (ADDERALL XR)  25 MG 24 hr capsule    Sig: Take 1 capsule by mouth every morning.    Dispense:  30 capsule    Refill:  0  . amphetamine-dextroamphetamine (ADDERALL XR) 25 MG 24 hr capsule    Sig: Take 1 capsule by mouth every morning.    Dispense:  30 capsule    Refill:  0    Follow-up: Return in about 4 months (around 12/09/2019), or ADD.    Nani Gasseratherine Metheney, MD

## 2019-08-09 NOTE — Assessment & Plan Note (Signed)
Well controlled. Continue current regimen. Follow up in  4 mo 

## 2019-08-09 NOTE — Assessment & Plan Note (Signed)
Discussed warm compresses up to 3 times a day.  We will also prescribe erythromycin ophthalmic ointment to put in the eye and on the lid.  If not improving then will refer to optometry for further evaluation and possible I&D.

## 2019-08-09 NOTE — Assessment & Plan Note (Signed)
Due to check A1C. He will go to the lab.

## 2019-12-03 ENCOUNTER — Other Ambulatory Visit: Payer: Self-pay

## 2019-12-03 DIAGNOSIS — F988 Other specified behavioral and emotional disorders with onset usually occurring in childhood and adolescence: Secondary | ICD-10-CM

## 2019-12-03 MED ORDER — AMPHETAMINE-DEXTROAMPHET ER 25 MG PO CP24: 25.0000 mg | ORAL_CAPSULE | ORAL | 0 refills | Status: DC

## 2019-12-03 NOTE — Telephone Encounter (Signed)
Hermann is requesting a refill on Adderall. He has been scheduled for December 21 st for a follow up.

## 2019-12-13 ENCOUNTER — Ambulatory Visit (INDEPENDENT_AMBULATORY_CARE_PROVIDER_SITE_OTHER): Payer: BC Managed Care – PPO | Admitting: Family Medicine

## 2019-12-13 ENCOUNTER — Encounter: Payer: Self-pay | Admitting: Family Medicine

## 2019-12-13 VITALS — Ht 68.0 in

## 2019-12-13 DIAGNOSIS — E785 Hyperlipidemia, unspecified: Secondary | ICD-10-CM | POA: Diagnosis not present

## 2019-12-13 DIAGNOSIS — R7301 Impaired fasting glucose: Secondary | ICD-10-CM

## 2019-12-13 DIAGNOSIS — E7800 Pure hypercholesterolemia, unspecified: Secondary | ICD-10-CM | POA: Insufficient documentation

## 2019-12-13 DIAGNOSIS — F988 Other specified behavioral and emotional disorders with onset usually occurring in childhood and adolescence: Secondary | ICD-10-CM | POA: Diagnosis not present

## 2019-12-13 MED ORDER — AMPHETAMINE-DEXTROAMPHET ER 25 MG PO CP24: 25.0000 mg | ORAL_CAPSULE | ORAL | 0 refills | Status: DC

## 2019-12-13 NOTE — Progress Notes (Signed)
He is doing well on current regimen. Denies any problems w/sleep,appetite or heart palpitations.

## 2019-12-13 NOTE — Progress Notes (Signed)
Virtual Visit via Video Note  I connected with Warren Rivas on 12/13/19 at  7:50 AM EST by a video enabled telemedicine application and verified that I am speaking with the correct person using two identifiers.   I discussed the limitations of evaluation and management by telemedicine and the availability of in person appointments. The patient expressed understanding and agreed to proceed.  Subjective:    CC: Follow-up ADD/due for lab work.  HPI: ADD - Reports symptoms are well controlled on current regime. Denies any problems with insomnia, chest pain, palpitations, or SOB.    Did not get a chance to go to the lab for blood work to follow-up on his prediabetes and elevated cholesterol.  Past medical history, Surgical history, Family history not pertinant except as noted below, Social history, Allergies, and medications have been entered into the medical record, reviewed, and corrections made.   Review of Systems: No fevers, chills, night sweats, weight loss, chest pain, or shortness of breath.   Objective:    General: Speaking clearly in complete sentences without any shortness of breath.  Alert and oriented x3.  Normal judgment. No apparent acute distress.    Impression and Recommendations:     ADD -needs refills on medication.  Asymptomatic.  Did encourage him to try to check his blood pressure at home every now and again on the stimulant just to make sure that it looks good.   IFG - due for repeat A1C.  Minded him to go to the lab when he is able.  Hyperlipidemia - due to recheck cholesterol  I discussed the assessment and treatment plan with the patient. The patient was provided an opportunity to ask questions and all were answered. The patient agreed with the plan and demonstrated an understanding of the instructions.   The patient was advised to call back or seek an in-person evaluation if the symptoms worsen or if the condition fails to improve as  anticipated.   Beatrice Lecher, MD

## 2020-01-14 ENCOUNTER — Ambulatory Visit (INDEPENDENT_AMBULATORY_CARE_PROVIDER_SITE_OTHER): Payer: BC Managed Care – PPO | Admitting: Family Medicine

## 2020-01-14 ENCOUNTER — Encounter: Payer: Self-pay | Admitting: Family Medicine

## 2020-01-14 ENCOUNTER — Other Ambulatory Visit: Payer: Self-pay

## 2020-01-14 VITALS — BP 138/80 | HR 82 | Ht 68.0 in | Wt 207.0 lb

## 2020-01-14 DIAGNOSIS — F988 Other specified behavioral and emotional disorders with onset usually occurring in childhood and adolescence: Secondary | ICD-10-CM | POA: Diagnosis not present

## 2020-01-14 DIAGNOSIS — R7303 Prediabetes: Secondary | ICD-10-CM | POA: Diagnosis not present

## 2020-01-14 DIAGNOSIS — N529 Male erectile dysfunction, unspecified: Secondary | ICD-10-CM

## 2020-01-14 LAB — POCT GLYCOSYLATED HEMOGLOBIN (HGB A1C): Hemoglobin A1C: 5.5 % (ref 4.0–5.6)

## 2020-01-14 MED ORDER — TADALAFIL 20 MG PO TABS: 10.0000 mg | ORAL_TABLET | ORAL | 11 refills | Status: DC | PRN

## 2020-01-14 NOTE — Progress Notes (Signed)
Established Patient Office Visit  Subjective:  Patient ID: Warren Rivas, male    DOB: 1971/06/11  Age: 49 y.o. MRN: [id number]  CC:  Chief Complaint  Patient presents with  . Medication Reaction    HPI QUINNLAN ABRUZZO presents for problems with erectile dysfunction.  He said years ago when he first got on Adderall he had noticed a little bit of difficulty with ED but it was never a major problem so never discontinued it.  More recently since being on Adderall it has become a much more significant problem.  He said he and his wife talked about it so he just decided to discontinue the medication completely about 2 and half weeks ago but is here today because it did not correct the erectile dysfunction to come off of the medication.  He feels like his libido is normal.  He works out every day and goes to [employer] and said he has not had any problems with muscle mass or loss of strength.  He feels like his energy levels are good.  He feels like overall he denies feeling sad.  He denies falling asleep excessively.  He feels like he eats a healthy diet he drinks plenty of water and stays hydrated.  He avoids excessive caffeine.  He does not have hypertension or diabetes.  Impaired fasting glucose-no increased thirst or urination. No symptoms consistent with hypoglycemia.  No past medical history on file.  No past surgical history on file.  Family History  Problem Relation Age of Onset  . Cancer Father        lung    Social History   Socioeconomic History  . Marital status: Married    Spouse name: Not on file  . Number of children: Not on file  . Years of education: Not on file  . Highest education level: Not on file  Occupational History  . Not on file  Tobacco Use  . Smoking status: Former Games developer  . Smokeless tobacco: Never Used  Substance and Sexual Activity  . Alcohol use: No  . Drug use: No  . Sexual activity: Not on file  Other Topics Concern  . Not on file  Social  History Narrative  . Not on file   Social Determinants of Health   Financial Resource Strain:   . Difficulty of Paying Living Expenses: Not on file  Food Insecurity:   . Worried About Programme researcher, broadcasting/film/video in the Last Year: Not on file  . Ran Out of Food in the Last Year: Not on file  Transportation Needs:   . Lack of Transportation (Medical): Not on file  . Lack of Transportation (Non-Medical): Not on file  Physical Activity:   . Days of Exercise per Week: Not on file  . Minutes of Exercise per Session: Not on file  Stress:   . Feeling of Stress : Not on file  Social Connections:   . Frequency of Communication with Friends and Family: Not on file  . Frequency of Social Gatherings with Friends and Family: Not on file  . Attends Religious Services: Not on file  . Active Member of Clubs or Organizations: Not on file  . Attends Banker Meetings: Not on file  . Marital Status: Not on file  Intimate Partner Violence:   . Fear of Current or Ex-Partner: Not on file  . Emotionally Abused: Not on file  . Physically Abused: Not on file  . Sexually Abused: Not on file  Outpatient Medications Prior to Visit  Medication Sig Dispense Refill  . [START ON 02/11/2020] amphetamine-dextroamphetamine (ADDERALL XR) 25 MG 24 hr capsule Take 1 capsule by mouth every morning. (Patient not taking: Reported on 01/14/2020) 30 capsule 0  . amphetamine-dextroamphetamine (ADDERALL XR) 25 MG 24 hr capsule Take 1 capsule by mouth every morning. (Patient not taking: Reported on 01/14/2020) 30 capsule 0  . amphetamine-dextroamphetamine (ADDERALL XR) 25 MG 24 hr capsule Take 1 capsule by mouth every morning. (Patient not taking: Reported on 01/14/2020) 30 capsule 0   No facility-administered medications prior to visit.    No Known Allergies  ROS Review of Systems    Objective:    Physical Exam  BP 138/80   Pulse 82   Ht 5\' 8"  (1.727 m)   Wt 207 lb (93.9 kg)   SpO2 97%   BMI 31.47 kg/m   Wt Readings from Last 3 Encounters:  01/14/20 207 lb (93.9 kg)  08/09/19 216 lb (98 kg)  05/11/19 200 lb (90.7 kg)     There are no preventive care reminders to display for this patient.  There are no preventive care reminders to display for this patient.  Lab Results  Component Value Date   TSH 1.462 11/28/2014   Lab Results  Component Value Date   WBC 5.5 11/28/2014   HGB 15.5 11/28/2014   HCT 44.8 11/28/2014   MCV 83.9 11/28/2014   PLT 217 11/28/2014   Lab Results  Component Value Date   NA 136 06/05/2017   K 4.0 06/05/2017   CO2 26 06/05/2017   GLUCOSE 120 (H) 06/05/2017   BUN 11 06/05/2017   CREATININE 1.11 06/05/2017   BILITOT 0.6 06/05/2017   ALKPHOS 68 06/05/2017   AST 28 06/05/2017   ALT 26 06/05/2017   PROT 6.7 06/05/2017   ALBUMIN 4.5 06/05/2017   CALCIUM 8.9 06/05/2017   Lab Results  Component Value Date   CHOL 186 06/05/2017   Lab Results  Component Value Date   HDL 42 06/05/2017   Lab Results  Component Value Date   LDLCALC 128 (H) 06/05/2017   Lab Results  Component Value Date   TRIG 80 06/05/2017   Lab Results  Component Value Date   CHOLHDL 4.4 06/05/2017   Lab Results  Component Value Date   HGBA1C 5.5 01/14/2020      Assessment & Plan:   Problem List Items Addressed This Visit      Other   Pre-diabetes   Relevant Orders   POCT glycosylated hemoglobin (Hb A1C) (Completed)   Attention deficit disorder    Other Visit Diagnoses    Erectile dysfunction, unspecified erectile dysfunction type    -  Primary     AUA score 7 which is considered significant for erectile dysfunction.  Negative low testosterone screening.  There is an approximately 2% reported cases of erectile dysfunction on Adderall and we discussed that today.  But since stopping it 2-1/2 weeks ago he is still having some difficulty.  We discussed that it could be somewhat psychological which may have been initially triggered by the medication but now has  persisted so recommended a trial of Cialis.  Recommend starting with half of a tab.  Call if any problems or concerns.  If he continues to need the medication or it does not seem to be effective then please call 01/16/2020 back.  He does not have risk factors including hypertension or diabetes.  He has only had mildly elevated cholesterol levels in  the past.  No known history of coronary artery disease or peripheral vascular disease.  And is no longer on any medications.  Did encourage him to go ahead and schedule physical as well and we can also do some blood work including an CBC and TSH at that time.  IFG -great news!  A1c looks great today and is actually back into the normal range which rules out diabetes as a potential causes of his erectile dysfunction.   Meds ordered this encounter  Medications  . tadalafil (CIALIS) 20 MG tablet    Sig: Take 0.5-1 tablets (10-20 mg total) by mouth every other day as needed for erectile dysfunction.    Dispense:  5 tablet    Refill:  11    Follow-up: No follow-ups on file.    Beatrice Lecher, MD

## 2020-01-14 NOTE — Progress Notes (Signed)
Pt reports that he has noticed a decline in his sex drive and slight issues with sleep.  He discontinued Adderall 2.5 weeks ago.

## 2020-01-31 ENCOUNTER — Encounter: Payer: BC Managed Care – PPO | Admitting: Family Medicine

## 2020-02-10 ENCOUNTER — Encounter: Payer: BC Managed Care – PPO | Admitting: Family Medicine

## 2021-02-23 ENCOUNTER — Other Ambulatory Visit: Payer: Self-pay | Admitting: Family Medicine

## 2022-05-27 ENCOUNTER — Other Ambulatory Visit: Payer: Self-pay | Admitting: Family Medicine

## 2022-05-28 ENCOUNTER — Other Ambulatory Visit: Payer: Self-pay | Admitting: Family Medicine

## 2022-07-31 ENCOUNTER — Other Ambulatory Visit: Payer: Self-pay | Admitting: Family Medicine

## 2022-09-06 ENCOUNTER — Other Ambulatory Visit: Payer: Self-pay | Admitting: Family Medicine

## 2022-09-09 NOTE — Telephone Encounter (Signed)
Please call pt he will need an appointment for refills

## 2022-09-10 NOTE — Telephone Encounter (Signed)
Scheduled for 09/23/2022- tvt 

## 2022-09-23 ENCOUNTER — Ambulatory Visit (INDEPENDENT_AMBULATORY_CARE_PROVIDER_SITE_OTHER): Payer: BC Managed Care – PPO | Admitting: Family Medicine

## 2022-09-23 ENCOUNTER — Encounter: Payer: Self-pay | Admitting: Family Medicine

## 2022-09-23 VITALS — BP 137/70 | HR 67 | Ht 68.0 in | Wt 214.0 lb

## 2022-09-23 DIAGNOSIS — Z1211 Encounter for screening for malignant neoplasm of colon: Secondary | ICD-10-CM

## 2022-09-23 DIAGNOSIS — R7303 Prediabetes: Secondary | ICD-10-CM

## 2022-09-23 DIAGNOSIS — E7800 Pure hypercholesterolemia, unspecified: Secondary | ICD-10-CM

## 2022-09-23 DIAGNOSIS — F988 Other specified behavioral and emotional disorders with onset usually occurring in childhood and adolescence: Secondary | ICD-10-CM | POA: Diagnosis not present

## 2022-09-23 DIAGNOSIS — N529 Male erectile dysfunction, unspecified: Secondary | ICD-10-CM

## 2022-09-23 DIAGNOSIS — Z125 Encounter for screening for malignant neoplasm of prostate: Secondary | ICD-10-CM

## 2022-09-23 MED ORDER — TADALAFIL 20 MG PO TABS: 10.0000 mg | ORAL_TABLET | ORAL | 99 refills | Status: DC | PRN

## 2022-09-23 MED ORDER — METHYLPHENIDATE HCL ER (OSM) 27 MG PO TBCR: 27.0000 mg | EXTENDED_RELEASE_TABLET | ORAL | 0 refills | Status: DC

## 2022-09-23 NOTE — Progress Notes (Signed)
Established Patient Office Visit  Subjective   Patient ID: Warren Rivas, male    DOB: 28-Apr-1971  Age: 51 y.o. MRN: [id number]  Chief Complaint  Patient presents with   Erectile Dysfunction    HPI  F/U ED -is doing well with the Cialis.  His insurance does not cover much on it so he is paying out-of-pocket for but it does seem to be effective and he does not have any concerns or side effects.  Follow-up ADD-he ended up coming off of the Adderall.  His wife felt like it was really affecting his mood.  Though he felt like it was really helpful for helping him stay on task and concentrate and focus at work.  He is interested in considering an alternative.  He has tried Oncologist in the past.  He took it for about a month and had significant nausea that never seem to resolve.  No recent chest pain.  No underlying cardiac diagnoses.    ROS    Objective:     BP 137/70   Pulse 67   Ht 5\' 8"  (1.727 m)   Wt 214 lb (97.1 kg)   SpO2 96%   BMI 32.54 kg/m    Physical Exam Constitutional:      Appearance: He is well-developed.  HENT:     Head: Normocephalic and atraumatic.  Cardiovascular:     Rate and Rhythm: Normal rate and regular rhythm.     Heart sounds: Normal heart sounds.  Pulmonary:     Effort: Pulmonary effort is normal.     Breath sounds: Normal breath sounds.  Skin:    General: Skin is warm and dry.  Neurological:     Mental Status: He is alert and oriented to person, place, and time.  Psychiatric:        Behavior: Behavior normal.      No results found for any visits on 09/23/22.    The ASCVD Risk score (Arnett DK, et al., 2019) failed to calculate for the following reasons:   Cannot find a previous HDL lab   Cannot find a previous total cholesterol lab    Assessment & Plan:   Problem List Items Addressed This Visit       Other   Pre-diabetes    Due to recheck A1c.      Relevant Orders   Lipid panel   COMPLETE METABOLIC PANEL WITH GFR    CBC   PSA   HgB A1c   Erectile dysfunction    Happy with current regimen.      Relevant Medications   tadalafil (CIALIS) 20 MG tablet   Elevated LDL cholesterol level    Encouraged him to go and get labs done its been 5 years since we have taken a look at his lipids.      Relevant Orders   Lipid panel   COMPLETE METABOLIC PANEL WITH GFR   CBC   PSA   HgB A1c   Attention deficit disorder - Primary    He felt like he got some irritability and shift in mood with the Adderall so we discussed trying methylphenidate.  New prescription sent to pharmacy if he is happy with that he can let me know it looks like it would be less expensive through his mail order pharmacy.  He has tried Oncologist in the past but had nausea.  Follow-up in 3 months.      Other Visit Diagnoses     Screening for malignant neoplasm  of colon       Relevant Orders   Cologuard   Screening PSA (prostate specific antigen)       Relevant Orders   Lipid panel   COMPLETE METABOLIC PANEL WITH GFR   CBC   PSA   HgB A1c      Colon cancer screening options he is interested in doing Cologuard.  Return in about 3 months (around 12/24/2022) for New start medication.    Nani Gasser, MD

## 2022-09-23 NOTE — Assessment & Plan Note (Signed)
He felt like he got some irritability and shift in mood with the Adderall so we discussed trying methylphenidate.  New prescription sent to pharmacy if he is happy with that he can let me know it looks like it would be less expensive through his mail order pharmacy.  He has tried Oncologist in the past but had nausea.  Follow-up in 3 months.

## 2022-09-23 NOTE — Assessment & Plan Note (Signed)
Due to recheck A1c. 

## 2022-09-23 NOTE — Assessment & Plan Note (Signed)
Happy with current regimen.

## 2022-09-23 NOTE — Assessment & Plan Note (Signed)
Encouraged him to go and get labs done its been 5 years since we have taken a look at his lipids.

## 2022-12-02 ENCOUNTER — Other Ambulatory Visit: Payer: Self-pay | Admitting: *Deleted

## 2022-12-02 MED ORDER — METHYLPHENIDATE HCL ER (OSM) 27 MG PO TBCR: 27.0000 mg | EXTENDED_RELEASE_TABLET | ORAL | 0 refills | Status: DC

## 2023-06-30 DIAGNOSIS — M5413 Radiculopathy, cervicothoracic region: Secondary | ICD-10-CM | POA: Diagnosis not present

## 2023-06-30 DIAGNOSIS — M50323 Other cervical disc degeneration at C6-C7 level: Secondary | ICD-10-CM | POA: Diagnosis not present

## 2023-06-30 DIAGNOSIS — M9901 Segmental and somatic dysfunction of cervical region: Secondary | ICD-10-CM | POA: Diagnosis not present

## 2023-06-30 DIAGNOSIS — M50322 Other cervical disc degeneration at C5-C6 level: Secondary | ICD-10-CM | POA: Diagnosis not present

## 2023-07-01 DIAGNOSIS — M5413 Radiculopathy, cervicothoracic region: Secondary | ICD-10-CM | POA: Diagnosis not present

## 2023-07-01 DIAGNOSIS — M9901 Segmental and somatic dysfunction of cervical region: Secondary | ICD-10-CM | POA: Diagnosis not present

## 2023-07-01 DIAGNOSIS — M50322 Other cervical disc degeneration at C5-C6 level: Secondary | ICD-10-CM | POA: Diagnosis not present

## 2023-07-01 DIAGNOSIS — M50323 Other cervical disc degeneration at C6-C7 level: Secondary | ICD-10-CM | POA: Diagnosis not present

## 2023-07-03 DIAGNOSIS — M50323 Other cervical disc degeneration at C6-C7 level: Secondary | ICD-10-CM | POA: Diagnosis not present

## 2023-07-03 DIAGNOSIS — M5413 Radiculopathy, cervicothoracic region: Secondary | ICD-10-CM | POA: Diagnosis not present

## 2023-07-03 DIAGNOSIS — M50322 Other cervical disc degeneration at C5-C6 level: Secondary | ICD-10-CM | POA: Diagnosis not present

## 2023-07-03 DIAGNOSIS — M9901 Segmental and somatic dysfunction of cervical region: Secondary | ICD-10-CM | POA: Diagnosis not present

## 2023-10-31 ENCOUNTER — Other Ambulatory Visit: Payer: Self-pay | Admitting: Family Medicine

## 2023-10-31 DIAGNOSIS — N529 Male erectile dysfunction, unspecified: Secondary | ICD-10-CM

## 2024-07-28 ENCOUNTER — Other Ambulatory Visit: Payer: Self-pay

## 2024-07-28 ENCOUNTER — Ambulatory Visit
Admission: EM | Admit: 2024-07-28 | Discharge: 2024-07-28 | Disposition: A | Discharge status: Home / Self Care | Attending: Family Medicine | Admitting: Family Medicine

## 2024-07-28 DIAGNOSIS — L03114 Cellulitis of left upper limb: Secondary | ICD-10-CM

## 2024-07-28 HISTORY — DX: Other fatigue: R53.83

## 2024-07-28 MED ORDER — CEFADROXIL 500 MG PO CAPS: 500.0000 mg | ORAL_CAPSULE | Freq: Two times a day (BID) | ORAL | 0 refills | Status: DC

## 2024-07-28 MED ORDER — IBUPROFEN 800 MG PO TABS: 800.0000 mg | ORAL_TABLET | Freq: Three times a day (TID) | ORAL | 0 refills | Status: DC

## 2024-07-28 NOTE — ED Triage Notes (Signed)
 Does own testosterone  shots, put it in left deltoid and now has redness, swelling, pain to left deltoid since Friday evening. Has felt hot lately, but has not taken temperature. Has used ibuprofen  prn.

## 2024-07-28 NOTE — Discharge Instructions (Signed)
 Give shots in your thighs Alternate legs Return as needed Take the antibiotic 2 x a day Take the ibuprofen  3 x a day with food

## 2024-07-28 NOTE — ED Provider Notes (Signed)
 [facility]    CSN: [id number] Arrival date & time: 07/28/24  1254      History   Chief Complaint Chief Complaint  Patient presents with   Arm Injury    HPI Warren Rivas is a 53 y.o. male.   HPI  Patient states he does his own testosterone  shots.  He states is awkward because he always gives them in his left arm, he is right-handed.  He does not know anywhere else to give the shots but his deltoid.  He has redness swelling and warmth in his deltoid after his last injection 5 days ago.  He states it is painful to move his arm.  He has redness and swelling in the area surrounding the injection.  Distal neurovascular is intact.  Past Medical History:  Diagnosis Date   Fatigue     Patient Active Problem List   Diagnosis Date Noted   Erectile dysfunction 09/23/2022   Elevated LDL cholesterol level 12/13/2019   Hordeolum externum of right upper eyelid 08/09/2019   Pre-diabetes 11/30/2014   Attention deficit disorder 10/20/2006    History reviewed. No pertinent surgical history.     Home Medications    Prior to Admission medications   Medication Sig Start Date End Date Taking? Authorizing Provider  cefadroxil  (DURICEF) 500 MG capsule Take 1 capsule (500 mg total) by mouth 2 (two) times daily. 07/28/24  Yes Maranda Jamee Jacob, MD  ibuprofen  (ADVIL ) 800 MG tablet Take 1 tablet (800 mg total) by mouth 3 (three) times daily. 07/28/24  Yes Maranda Jamee Jacob, MD  testosterone  cypionate (DEPOTESTOSTERONE CYPIONATE) 200 MG/ML injection Inject into the muscle every 14 (fourteen) days.   Yes [provider]    Family History Family History  Problem Relation Age of Onset   Cancer Father        lung    Social History Social History   Tobacco Use   Smoking status: Former   Smokeless tobacco: Never  Substance Use Topics   Alcohol use: No   Drug use: No     Allergies   Patient has no known allergies.   Review of Systems Review of Systems See  HPI  Physical Exam Triage Vital Signs ED Triage Vitals  Encounter Vitals Group     BP 07/28/24 1302 (!) 152/87     Girls Systolic BP Percentile --      Girls Diastolic BP Percentile --      Boys Systolic BP Percentile --      Boys Diastolic BP Percentile --      Pulse Rate 07/28/24 1302 96     Resp 07/28/24 1302 16     Temp 07/28/24 1302 98.4 F (36.9 C)     Temp src --      SpO2 07/28/24 1302 96 %     Weight --      Height --      Head Circumference --      Peak Flow --      Pain Score 07/28/24 1304 6     Pain Loc --      Pain Education --      Exclude from Growth Chart --    No data found.  Updated Vital Signs BP (!) 152/87   Pulse 96   Temp 98.4 F (36.9 C)   Resp 16   SpO2 96%      Physical Exam Constitutional:      General: He is not in acute distress.  Appearance: He is well-developed and normal weight.  HENT:     Head: Normocephalic and atraumatic.  Eyes:     Conjunctiva/sclera: Conjunctivae normal.     Pupils: Pupils are equal, round, and reactive to light.  Cardiovascular:     Rate and Rhythm: Normal rate.  Pulmonary:     Effort: Pulmonary effort is normal. No respiratory distress.  Abdominal:     General: There is no distension.     Palpations: Abdomen is soft.  Musculoskeletal:        General: Normal range of motion.     Cervical back: Normal range of motion.  Skin:    General: Skin is warm and dry.     Findings: Erythema present.     Comments: The right deltoid has induration, warmth, and tenderness and an area about 10 cm surrounding the point of injection.  No palpable abscess.  Neurological:     Mental Status: He is alert.      UC Treatments / Results  Labs (all labs ordered are listed, but only abnormal results are displayed) Labs Reviewed - No data to display  EKG   Radiology No results found.  Procedures Procedures (including critical care time)  Medications Ordered in UC Medications - No data to display  Initial  Impression / Assessment and Plan / UC Course  I have reviewed the triage vital signs and the nursing notes.  Pertinent labs & imaging results that were available during my care of the patient were reviewed by me and considered in my medical decision making (see chart for details).     Chart reaction.  Probable cellulitis.  Discussed injection techniques and locations.  I gave him a handout on giving the shots in his lateral quad Final Clinical Impressions(s) / UC Diagnoses   Final diagnoses:  Cellulitis of left upper arm     Discharge Instructions      Give shots in your thighs Alternate legs Return as needed Take the antibiotic 2 x a day Take the ibuprofen  3 x a day with food     ED Prescriptions     Medication Sig Dispense Auth. Provider   cefadroxil  (DURICEF) 500 MG capsule Take 1 capsule (500 mg total) by mouth 2 (two) times daily. 14 capsule Maranda Jamee Jacob, MD   ibuprofen  (ADVIL ) 800 MG tablet Take 1 tablet (800 mg total) by mouth 3 (three) times daily. 21 tablet Maranda Jamee Jacob, MD      PDMP not reviewed this encounter.   Maranda Jamee Jacob, MD 07/28/24 1739

## 2024-11-16 ENCOUNTER — Other Ambulatory Visit: Payer: Self-pay | Admitting: Family Medicine

## 2024-11-16 DIAGNOSIS — N529 Male erectile dysfunction, unspecified: Secondary | ICD-10-CM

## 2024-11-17 ENCOUNTER — Telehealth: Payer: Self-pay | Admitting: Family Medicine

## 2024-11-17 NOTE — Telephone Encounter (Signed)
 Copied from CRM [id number]. Topic: Clinical - Medication Refill >> Nov 17, 2024  3:21 PM Victoria B wrote: Medication: Tadalafil  20 MG Med was denied for appt, pt now has an appt on 12/31  Has the patient contacted their pharmacy? yes (Agent: If no, request that the patient contact the pharmacy for the refill. If patient does not wish to contact the pharmacy document the reason why and proceed with request.) (Agent: If yes, when and what did the pharmacy advise?)contact pcp  This is the patient's preferred pharmacy:  [facility] [phone] - [place], [place] - [address] [place] [address] Phone: [phone] Fax: [phone]  Is this the correct pharmacy for this prescription? yes Has the prescription been filled recently? no  Is the patient out of the medication? yes  Has the patient been seen for an appointment in the last year OR does the patient have an upcoming appointment? yes  Can we respond through MyChart? yes  Agent: Please be advised that Rx refills may take up to 3 business days. We ask that you follow-up with your pharmacy.

## 2024-11-23 ENCOUNTER — Ambulatory Visit: Admitting: Urgent Care

## 2024-11-23 ENCOUNTER — Encounter: Payer: Self-pay | Admitting: Urgent Care

## 2024-11-23 VITALS — BP 158/90 | HR 75 | Ht 70.0 in | Wt 220.0 lb

## 2024-11-23 DIAGNOSIS — M25511 Pain in right shoulder: Secondary | ICD-10-CM

## 2024-11-23 DIAGNOSIS — Z1211 Encounter for screening for malignant neoplasm of colon: Secondary | ICD-10-CM | POA: Diagnosis not present

## 2024-11-23 DIAGNOSIS — Z125 Encounter for screening for malignant neoplasm of prostate: Secondary | ICD-10-CM | POA: Diagnosis not present

## 2024-11-23 DIAGNOSIS — N529 Male erectile dysfunction, unspecified: Secondary | ICD-10-CM

## 2024-11-23 DIAGNOSIS — Z79890 Hormone replacement therapy (postmenopausal): Secondary | ICD-10-CM

## 2024-11-23 DIAGNOSIS — R030 Elevated blood-pressure reading, without diagnosis of hypertension: Secondary | ICD-10-CM | POA: Diagnosis not present

## 2024-11-23 DIAGNOSIS — R7303 Prediabetes: Secondary | ICD-10-CM

## 2024-11-23 DIAGNOSIS — R011 Cardiac murmur, unspecified: Secondary | ICD-10-CM

## 2024-11-23 MED ORDER — TADALAFIL 20 MG PO TABS: 20.0000 mg | ORAL_TABLET | ORAL | 11 refills | Status: AC | PRN

## 2024-11-23 MED ORDER — LISINOPRIL 10 MG PO TABS: 10.0000 mg | ORAL_TABLET | Freq: Every day | ORAL | 0 refills | Status: AC

## 2024-11-23 NOTE — Patient Instructions (Addendum)
 Great to meet you!!  We drew your labs today.  Your blood pressure has been rather elevated at your last several office visits.  Given your newly noted heart murmur, we must get this under control.   Please start taking lisinopril every morning with breakfast. Please get a blood pressure cuff and monitor your home readings - goal is 120/80.  Please follow up with GI for your colonoscopy.  Regarding your shoulder, I suspect you have a labral tear. Please go to [address] for an xray. If this is normal, we will need to proceed with MR-arthrogram.   Please follow up in 2 weeks for blood pressure recheck and lab review

## 2024-11-23 NOTE — Telephone Encounter (Signed)
 Patient informed and is scheduled for today 11/23/2024 at 1pm

## 2024-11-23 NOTE — Progress Notes (Signed)
 Established Patient Office Visit  Subjective:  Patient ID: Warren Rivas, male    DOB: 24-Feb-1971  Age: 53 y.o. MRN: [id number]  Chief Complaint  Patient presents with   Medical Management of Chronic Issues    Pleasant 53yo male presents today primarily with request to refill his cialis . Has been taking PRN for ED for several years without ADRs. Has not been to our clinic since 2023.  He has been purchasing testosterone  replacement online and has been taking microdoses three days a week (M,W,F).   Pt has lost 20# in the past few years through diet and exercise. States he feels better than he has in a while.  Denies snoring. His daughter is in nursing school and has been monitoring his blood pressure at home, noting many readings being elevated, but pt uncertain of exact reading.  Pt's only complaint today is R shoulder pain, primarily when doing bench press. Pain on a daily basis is controlled, but worsened with lifting. No workup to date for this.  Pt agrees to referral for colonoscopy. He refuses all vaccines today. Discussed labs today - he ate bacon and eggs just 2-3 hours prior to the office visit today thus lipid panel has been deferred.    Patient Active Problem List   Diagnosis Date Noted   Erectile dysfunction 09/23/2022   Elevated LDL cholesterol level 12/13/2019   Hordeolum externum of right upper eyelid 08/09/2019   Pre-diabetes 11/30/2014   Attention deficit disorder 10/20/2006   Past Medical History:  Diagnosis Date   Fatigue    History reviewed. No pertinent surgical history. Social History   Tobacco Use   Smoking status: Former   Smokeless tobacco: Never  Substance Use Topics   Alcohol use: No   Drug use: No      ROS: as noted in HPI  Objective:     BP (!) 158/90   Pulse 75   Ht 5' 10 (1.778 m)   Wt 220 lb (99.8 kg)   SpO2 97%   BMI 31.57 kg/m  BP Readings from Last 3 Encounters:  11/23/24 (!) 158/90  07/28/24 (!) 152/87  09/23/22  137/70   Wt Readings from Last 3 Encounters:  11/23/24 220 lb (99.8 kg)  09/23/22 214 lb (97.1 kg)  01/14/20 207 lb (93.9 kg)      Physical Exam Vitals and nursing note reviewed. Exam conducted with a chaperone present.  Constitutional:      General: He is not in acute distress.    Appearance: Normal appearance. He is not ill-appearing, toxic-appearing or diaphoretic.  HENT:     Head: Normocephalic and atraumatic.     Right Ear: External ear normal.     Left Ear: External ear normal.     Nose: Nose normal.     Mouth/Throat:     Mouth: Mucous membranes are moist.     Pharynx: Oropharynx is clear. No oropharyngeal exudate or posterior oropharyngeal erythema.  Eyes:     General: No scleral icterus.    Pupils: Pupils are equal, round, and reactive to light.  Cardiovascular:     Rate and Rhythm: Normal rate and regular rhythm.     Heart sounds: Murmur (grade 1/6 holosystolic heard on R #2 ICS) heard.  Pulmonary:     Effort: Pulmonary effort is normal. No respiratory distress.     Breath sounds: Normal breath sounds. No stridor. No wheezing or rhonchi.  Musculoskeletal:        General: No swelling, deformity or  signs of injury. Normal range of motion.     Cervical back: Normal range of motion and neck supple. No rigidity or tenderness.     Comments: Negative yergason or speed test.  Negative hawkins/ neer Positive obrien test  Lymphadenopathy:     Cervical: No cervical adenopathy.  Skin:    General: Skin is warm and dry.     Findings: No erythema or rash.  Neurological:     General: No focal deficit present.     Mental Status: He is alert and oriented to person, place, and time.  Psychiatric:        Mood and Affect: Mood normal.      No results found for any visits on 11/23/24.  Last CBC Lab Results  Component Value Date   WBC 5.5 11/28/2014   HGB 15.5 11/28/2014   HCT 44.8 11/28/2014   MCV 83.9 11/28/2014   MCH 29.0 11/28/2014   RDW 14.1 11/28/2014   PLT 217  11/28/2014   Last metabolic panel Lab Results  Component Value Date   GLUCOSE 120 (H) 06/05/2017   NA 136 06/05/2017   K 4.0 06/05/2017   CL 102 06/05/2017   CO2 26 06/05/2017   BUN 11 06/05/2017   CREATININE 1.11 06/05/2017   GFRNONAA 79 06/05/2017   CALCIUM 8.9 06/05/2017   PROT 6.7 06/05/2017   ALBUMIN 4.5 06/05/2017   BILITOT 0.6 06/05/2017   ALKPHOS 68 06/05/2017   AST 28 06/05/2017   ALT 26 06/05/2017   Last lipids Lab Results  Component Value Date   CHOL 186 06/05/2017   HDL 42 06/05/2017   LDLCALC 128 (H) 06/05/2017   TRIG 80 06/05/2017   CHOLHDL 4.4 06/05/2017   Last hemoglobin A1c Lab Results  Component Value Date   HGBA1C 5.5 01/14/2020   Last thyroid functions Lab Results  Component Value Date   TSH 1.462 11/28/2014   Last vitamin D No results found for: 25OHVITD2, 25OHVITD3, VD25OH Last vitamin B12 and Folate No results found for: VITAMINB12, FOLATE    The ASCVD Risk score (Arnett DK, et al., 2019) failed to calculate for the following reasons:   Cannot find a previous HDL lab   Cannot find a previous total cholesterol lab  Assessment & Plan:  Acute pain of right shoulder -     DG Shoulder Right; Future  Colon cancer screening -     Ambulatory referral to Gastroenterology  Prostate cancer screening -     PSA, total and free  Prediabetes -     Hemoglobin A1c  Erectile dysfunction, unspecified erectile dysfunction type -     PSA, total and free -     Testosterone ,Free and Total -     Tadalafil ; Take 1 tablet (20 mg total) by mouth as needed for erectile dysfunction.  Dispense: 10 tablet; Refill: 11  Murmur, heart -     Lisinopril; Take 1 tablet (10 mg total) by mouth daily.  Dispense: 90 tablet; Refill: 0  Long-term current use of testosterone  replacement therapy -     PSA, total and free -     Testosterone ,Free and Total  Elevated blood pressure reading -     CMP14+EGFR -     CBC with Differential/Platelet -      TSH -     Lisinopril; Take 1 tablet (10 mg total) by mouth daily.  Dispense: 90 tablet; Refill: 0  R shoulder exam concerning for possible labral tear. Will start with R shoulder xray, if normal  will proceed with MR arthrogram. Pt denies need for Rx NSAIDs.  Discussed preventive care, refuses all vaccines at present time. Is amenable to referral for colonoscopy.  Will update labs today. Unable to obtain lipids due to eatings eggs/ bacon prior to visit, but prior labs >6 years ago. Will obtain new baseline. Will need screening labs for testosterone  as well.   Start lisinopril for BP regulation. Monitor at home, goal 120/80. New murmur noted, faint. Consider ECHO in future. Pt to RTC in 2 weeks for lab review and BP recheck   Return in about 2 weeks (around 12/07/2024).   Benton LITTIE Gave, PA

## 2024-11-24 ENCOUNTER — Ambulatory Visit: Payer: Self-pay | Admitting: Urgent Care

## 2024-11-24 DIAGNOSIS — R945 Abnormal results of liver function studies: Secondary | ICD-10-CM

## 2024-11-25 LAB — CBC WITH DIFFERENTIAL/PLATELET
Basophils Absolute: 0.1 x10E3/uL (ref 0.0–0.2)
Basos: 1 %
EOS (ABSOLUTE): 0.1 x10E3/uL (ref 0.0–0.4)
Eos: 2 %
Hematocrit: 54.5 % — ABNORMAL HIGH (ref 37.5–51.0)
Hemoglobin: 17.6 g/dL (ref 13.0–17.7)
Immature Grans (Abs): 0 x10E3/uL (ref 0.0–0.1)
Immature Granulocytes: 0 %
Lymphocytes Absolute: 0.9 x10E3/uL (ref 0.7–3.1)
Lymphs: 11 %
MCH: 28.9 pg (ref 26.6–33.0)
MCHC: 32.3 g/dL (ref 31.5–35.7)
MCV: 90 fL (ref 79–97)
Monocytes Absolute: 0.7 x10E3/uL (ref 0.1–0.9)
Monocytes: 8 %
Neutrophils Absolute: 6.6 x10E3/uL (ref 1.4–7.0)
Neutrophils: 77 %
Platelets: 192 x10E3/uL (ref 150–450)
RBC: 6.09 x10E6/uL — ABNORMAL HIGH (ref 4.14–5.80)
RDW: 14.4 % (ref 11.6–15.4)
WBC: 8.4 x10E3/uL (ref 3.4–10.8)

## 2024-11-25 LAB — CMP14+EGFR
ALT: 136 IU/L — ABNORMAL HIGH (ref 0–44)
AST: 90 IU/L — ABNORMAL HIGH (ref 0–40)
Albumin: 4.2 g/dL (ref 3.8–4.9)
Alkaline Phosphatase: 51 IU/L (ref 47–123)
BUN/Creatinine Ratio: 10 (ref 9–20)
BUN: 13 mg/dL (ref 6–24)
Bilirubin Total: 0.4 mg/dL (ref 0.0–1.2)
CO2: 23 mmol/L (ref 20–29)
Calcium: 9 mg/dL (ref 8.7–10.2)
Chloride: 100 mmol/L (ref 96–106)
Creatinine, Ser: 1.27 mg/dL (ref 0.76–1.27)
Globulin, Total: 2.4 g/dL (ref 1.5–4.5)
Glucose: 112 mg/dL — ABNORMAL HIGH (ref 70–99)
Potassium: 4.1 mmol/L (ref 3.5–5.2)
Sodium: 137 mmol/L (ref 134–144)
Total Protein: 6.6 g/dL (ref 6.0–8.5)
eGFR: 67 mL/min/1.73 (ref >59)

## 2024-11-25 LAB — PSA, TOTAL AND FREE
PSA, Free Pct: 36 %
PSA, Free: 0.18 ng/mL
Prostate Specific Ag, Serum: 0.5 ng/mL (ref 0.0–4.0)

## 2024-11-25 LAB — TESTOSTERONE,FREE AND TOTAL
Testosterone, Free: >50 pg/mL — AB (ref 7.2–24.0)
Testosterone: >1500 ng/dL — ABNORMAL HIGH (ref 264–916)

## 2024-11-25 LAB — HEMOGLOBIN A1C
Est. average glucose Bld gHb Est-mCnc: 114 mg/dL
Hgb A1c MFr Bld: 5.6 % (ref 4.8–5.6)

## 2024-11-25 LAB — TSH: TSH: 1.49 u[IU]/mL (ref 0.450–4.500)

## 2024-12-07 ENCOUNTER — Encounter: Payer: Self-pay | Admitting: Family Medicine

## 2024-12-07 ENCOUNTER — Ambulatory Visit: Admitting: Family Medicine

## 2024-12-07 VITALS — BP 131/61 | HR 71 | Ht 70.0 in | Wt 220.0 lb

## 2024-12-07 DIAGNOSIS — R7989 Other specified abnormal findings of blood chemistry: Secondary | ICD-10-CM | POA: Diagnosis not present

## 2024-12-07 NOTE — Progress Notes (Signed)
 He hasn't been called about getting his US  for Abdomen scheduled.

## 2024-12-07 NOTE — Progress Notes (Signed)
 Established Patient Office Visit  Patient ID: Warren Rivas, male    DOB: Mar 04, 1971  Age: 53 y.o. MRN: [id number] PCP: Alvan Dorothyann BIRCH, MD  Chief Complaint  Patient presents with   Hypertension    Subjective:     HPI  Discussed the use of AI scribe software for clinical note transcription with the patient, who gave verbal consent to proceed.  History of Present Illness Warren Rivas is a 53 year old male who presents with concerns about testosterone  supplementation and elevated liver enzymes.  Testosterone  supplementation - Initiated supplemental testosterone  in January 2025 due to exhaustion and decreased exercise performance. - Started supplementation without medical supervision based on advice from a friend. - Experienced significant improvement in energy levels and exercise capacity, including running and mountain biking. - Recent laboratory results showed elevated testosterone  levels. - Reduced dosage after lab results; previously took 25 mg three times weekly (Monday, Wednesday, Friday), totaling 75 mg per week. - Has not taken testosterone  since last visit.  Elevated liver enzymes - ALT level is three times the normal limit on recent laboratory testing. - No known family history of liver disease. - No alcohol consumption. - No acetaminophen  use; occasional ibuprofen  use approximately once every three weeks. - No recent illness or symptoms to explain liver enzyme elevation. 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     ROS    Objective:     BP 131/61   Pulse 71   Ht 5' 10 (1.778 m)   Wt 220 lb (99.8 kg)   SpO2 98%   BMI 31.57 kg/m    Physical Exam Vitals and nursing note reviewed.  Constitutional:      Appearance: Normal appearance.  HENT:     Head: Normocephalic and atraumatic.  Eyes:     Conjunctiva/sclera: Conjunctivae normal.  Cardiovascular:      Rate and Rhythm: Normal rate and regular rhythm.  Pulmonary:     Effort: Pulmonary effort is normal.     Breath sounds: Normal breath sounds.  Skin:    General: Skin is warm and dry.  Neurological:     Mental Status: He is alert.  Psychiatric:        Mood and Affect: Mood normal.      No results found for any visits on 12/07/24.    The ASCVD Risk score (Arnett DK, et al., 2019) failed to calculate for the following reasons:   Cannot find a previous HDL lab   Cannot find a previous total cholesterol lab   * - Cholesterol units were assumed    Assessment & Plan:   Problem List Items Addressed This Visit   None Visit Diagnoses       Elevated liver function tests    -  Primary   Relevant Orders   Iron, TIBC and Ferritin Panel   Tissue Transglutaminase Abs,IgG,IgA   Protime-INR   Mitochondrial antibodies   Anti-smooth muscle antibody, IgG   ANA   IgA   CBC   Hepatic function panel   US  ABDOMEN COMPLETE W/ELASTOGRAPHY       Assessment and Plan Assessment & Plan Testosterone  supplementation management Testosterone  levels elevated due to self-supplementation, risking suppressed natural production, testicular atrophy, cardiovascular and liver issues. Dosage reduced. - Continue reduced testosterone  dosage. - Recheck testosterone  levels in January. - Consider baseline testosterone  levels post-supplementation for insurance.  Evaluation of elevated liver enzymes ALT elevated, possibly linked  to testosterone  use. No liver disease history, alcohol use, or recent illness. - Order liver ultrasound for structure and fat content assessment. - Recheck liver enzymes today for trend analysis. - Follow-up liver enzyme check in January.  Screening for viral hepatitis Screening necessary due to elevated liver enzymes. No recent illness or alcohol use. - Order hepatitis panel (A, B, C, ) to rule out viral hepatitis.    No follow-ups on file.    Dorothyann Byars, MD [facility]  Health Primary Care & Sports Medicine at [facility]

## 2024-12-07 NOTE — Patient Instructions (Signed)
 Plan for repeat labs in Mid Januar.

## 2024-12-09 ENCOUNTER — Ambulatory Visit: Payer: Self-pay | Admitting: Family Medicine

## 2024-12-09 DIAGNOSIS — Z1211 Encounter for screening for malignant neoplasm of colon: Secondary | ICD-10-CM

## 2024-12-09 DIAGNOSIS — R790 Abnormal level of blood mineral: Secondary | ICD-10-CM

## 2024-12-09 LAB — HEPATIC FUNCTION PANEL
ALT: 141 IU/L — ABNORMAL HIGH (ref 0–44)
AST: 91 IU/L — ABNORMAL HIGH (ref 0–40)
Albumin: 4.8 g/dL (ref 3.8–4.9)
Alkaline Phosphatase: 51 IU/L (ref 47–123)
Bilirubin Total: 0.6 mg/dL (ref 0.0–1.2)
Bilirubin, Direct: 0.22 mg/dL (ref 0.00–0.40)
Total Protein: 7 g/dL (ref 6.0–8.5)

## 2024-12-09 LAB — TISSUE TRANSGLUTAMINASE ABS,IGG,IGA
t-Transglutaminase (tTG) IgA: <2 U/mL (ref 0–3)
t-Transglutaminase (tTG) IgG: 8 U/mL — AB (ref 0–5)

## 2024-12-09 LAB — IRON,TIBC AND FERRITIN PANEL
Ferritin: 42 ng/mL (ref 30–400)
Iron Saturation: 42 % (ref 15–55)
Iron: 192 ug/dL — ABNORMAL HIGH (ref 38–169)
Total Iron Binding Capacity: 454 ug/dL — ABNORMAL HIGH (ref 250–450)
UIBC: 262 ug/dL (ref 111–343)

## 2024-12-09 LAB — CBC
Hematocrit: 55.8 % — ABNORMAL HIGH (ref 37.5–51.0)
Hemoglobin: 18.1 g/dL — ABNORMAL HIGH (ref 13.0–17.7)
MCH: 28.8 pg (ref 26.6–33.0)
MCHC: 32.4 g/dL (ref 31.5–35.7)
MCV: 89 fL (ref 79–97)
Platelets: 308 x10E3/uL (ref 150–450)
RBC: 6.28 x10E6/uL — ABNORMAL HIGH (ref 4.14–5.80)
RDW: 14.1 % (ref 11.6–15.4)
WBC: 6.9 x10E3/uL (ref 3.4–10.8)

## 2024-12-09 LAB — ANTI-SMOOTH MUSCLE ANTIBODY, IGG: Smooth Muscle Ab: 5 U (ref 0–19)

## 2024-12-09 LAB — PROTIME-INR
INR: 1 (ref 0.9–1.2)
Prothrombin Time: 10.9 s (ref 9.1–12.0)

## 2024-12-09 LAB — MITOCHONDRIAL ANTIBODIES: Mitochondrial Ab: 20.8 U — ABNORMAL HIGH (ref 0.0–20.0)

## 2024-12-09 LAB — ANA: Anti Nuclear Antibody (ANA): NEGATIVE

## 2024-12-09 LAB — IGA: Immunoglobulin A, (IgA) QN, Serum: 126 mg/dL (ref 90–386)

## 2024-12-09 NOTE — Progress Notes (Signed)
 Hi Warren Rivas, Interestingly your iron levels are high.  Anybody in your family with a condition called hemochromatosis?  It is where the body will hold onto extra iron or are you taking any Supplement that has extra iron.  If not, then I would like to recheck it in a month and if it still elevated then may be worked you up for hemochromatosis. Also interestingly I did test you for gluten enteropathy also known as celiac disease and your antibody levels were weakly positive.  I have a low suspicion for you but it was interesting to see that the antibody levels were slightly elevated.  I know you are over 50 and I did not see a colonoscopy on your file.  Have you had that done yet?  Hemoglobin slightly elevated that is a direct effect of the testosterone .  It is something we have to keep an eye on because if it goes too high it can increase your risk for blood clots.  Your liver enzymes are still quite elevated.  They really have not trended down.  I know its only been 2 weeks but I would like to move forward with an ultrasound of the liver.  Hopefully they have already called to try to get you scheduled.  Please do at your convenience.

## 2024-12-22 ENCOUNTER — Ambulatory Visit: Admitting: Family Medicine

## 2024-12-24 ENCOUNTER — Other Ambulatory Visit

## 2025-01-24 ENCOUNTER — Encounter: Payer: Self-pay | Admitting: Gastroenterology
